# Patient Record
Sex: Male | Born: 1935 | Race: White | Hispanic: No | Marital: Married | Smoking: Former smoker
Health system: Southern US, Community
[De-identification: ages and names within clinical notes are randomized; demographics above are authoritative.]

## PROBLEM LIST (undated history)

## (undated) DIAGNOSIS — C189 Malignant neoplasm of colon, unspecified: Secondary | ICD-10-CM

## (undated) DIAGNOSIS — E785 Hyperlipidemia, unspecified: Secondary | ICD-10-CM

## (undated) DIAGNOSIS — N400 Enlarged prostate without lower urinary tract symptoms: Secondary | ICD-10-CM

## (undated) DIAGNOSIS — K219 Gastro-esophageal reflux disease without esophagitis: Secondary | ICD-10-CM

## (undated) DIAGNOSIS — G629 Polyneuropathy, unspecified: Secondary | ICD-10-CM

## (undated) DIAGNOSIS — E119 Type 2 diabetes mellitus without complications: Secondary | ICD-10-CM

## (undated) HISTORY — DX: Type 2 diabetes mellitus without complications: E11.9

## (undated) HISTORY — DX: Hyperlipidemia, unspecified: E78.5

## (undated) HISTORY — DX: Malignant neoplasm of colon, unspecified: C18.9

## (undated) HISTORY — DX: Polyneuropathy, unspecified: G62.9

## (undated) HISTORY — PX: FRACTURE SURGERY: SHX138

## (undated) HISTORY — DX: Benign prostatic hyperplasia without lower urinary tract symptoms: N40.0

## (undated) HISTORY — PX: CHOLECYSTECTOMY: SHX55

## (undated) HISTORY — DX: Gastro-esophageal reflux disease without esophagitis: K21.9

## (undated) HISTORY — PX: EYE SURGERY: SHX253

## (undated) HISTORY — PX: COLON SURGERY: SHX602

## (undated) HISTORY — PX: APPENDECTOMY: SHX54

---

## 1999-01-15 ENCOUNTER — Emergency Department (HOSPITAL_COMMUNITY): Admission: EM | Admit: 1999-01-15 | Discharge: 1999-01-15 | Payer: Self-pay | Admitting: Emergency Medicine

## 1999-01-15 ENCOUNTER — Encounter: Payer: Self-pay | Admitting: Specialist

## 1999-03-16 ENCOUNTER — Encounter: Payer: Self-pay | Admitting: Orthopedic Surgery

## 1999-03-16 ENCOUNTER — Inpatient Hospital Stay (HOSPITAL_COMMUNITY): Admission: RE | Admit: 1999-03-16 | Discharge: 1999-03-17 | Payer: Self-pay | Admitting: Orthopedic Surgery

## 2000-05-02 ENCOUNTER — Encounter: Admission: RE | Admit: 2000-05-02 | Discharge: 2000-07-31 | Payer: Self-pay | Admitting: Internal Medicine

## 2003-06-06 ENCOUNTER — Inpatient Hospital Stay (HOSPITAL_COMMUNITY): Admission: EM | Admit: 2003-06-06 | Discharge: 2003-06-14 | Payer: Self-pay | Admitting: Emergency Medicine

## 2004-11-03 ENCOUNTER — Ambulatory Visit (HOSPITAL_COMMUNITY): Admission: RE | Admit: 2004-11-03 | Discharge: 2004-11-03 | Payer: Self-pay | Admitting: Specialist

## 2004-11-16 ENCOUNTER — Ambulatory Visit (HOSPITAL_COMMUNITY): Admission: RE | Admit: 2004-11-16 | Discharge: 2004-11-16 | Payer: Self-pay | Admitting: Specialist

## 2005-12-10 ENCOUNTER — Encounter: Admission: RE | Admit: 2005-12-10 | Discharge: 2006-03-10 | Payer: Self-pay | Admitting: Internal Medicine

## 2006-05-01 ENCOUNTER — Ambulatory Visit (HOSPITAL_COMMUNITY): Admission: RE | Admit: 2006-05-01 | Discharge: 2006-05-01 | Payer: Self-pay | Admitting: Internal Medicine

## 2006-11-11 ENCOUNTER — Ambulatory Visit (HOSPITAL_COMMUNITY): Admission: RE | Admit: 2006-11-11 | Discharge: 2006-11-11 | Payer: Self-pay | Admitting: Neurology

## 2007-02-09 ENCOUNTER — Emergency Department (HOSPITAL_COMMUNITY): Admission: EM | Admit: 2007-02-09 | Discharge: 2007-02-09 | Payer: Self-pay | Admitting: Emergency Medicine

## 2007-04-18 ENCOUNTER — Ambulatory Visit (HOSPITAL_COMMUNITY): Admission: RE | Admit: 2007-04-18 | Discharge: 2007-04-18 | Payer: Self-pay | Admitting: Internal Medicine

## 2008-09-28 ENCOUNTER — Encounter: Payer: Self-pay | Admitting: Gastroenterology

## 2008-10-31 ENCOUNTER — Emergency Department (HOSPITAL_COMMUNITY): Admission: EM | Admit: 2008-10-31 | Discharge: 2008-10-31 | Payer: Self-pay | Admitting: Emergency Medicine

## 2008-10-31 ENCOUNTER — Encounter (INDEPENDENT_AMBULATORY_CARE_PROVIDER_SITE_OTHER): Payer: Self-pay | Admitting: *Deleted

## 2008-11-10 ENCOUNTER — Ambulatory Visit: Payer: Self-pay | Admitting: Gastroenterology

## 2008-11-10 DIAGNOSIS — R933 Abnormal findings on diagnostic imaging of other parts of digestive tract: Secondary | ICD-10-CM | POA: Insufficient documentation

## 2008-11-10 DIAGNOSIS — R109 Unspecified abdominal pain: Secondary | ICD-10-CM | POA: Insufficient documentation

## 2009-04-04 ENCOUNTER — Encounter: Admission: RE | Admit: 2009-04-04 | Discharge: 2009-04-04 | Payer: Self-pay | Admitting: Internal Medicine

## 2009-09-19 ENCOUNTER — Emergency Department (HOSPITAL_COMMUNITY): Admission: EM | Admit: 2009-09-19 | Discharge: 2009-09-19 | Payer: Self-pay | Admitting: Emergency Medicine

## 2009-09-28 ENCOUNTER — Ambulatory Visit: Payer: Self-pay | Admitting: Internal Medicine

## 2009-09-28 ENCOUNTER — Inpatient Hospital Stay (HOSPITAL_COMMUNITY): Admission: EM | Admit: 2009-09-28 | Discharge: 2009-10-13 | Payer: Self-pay | Admitting: Emergency Medicine

## 2009-09-30 ENCOUNTER — Encounter (INDEPENDENT_AMBULATORY_CARE_PROVIDER_SITE_OTHER): Payer: Self-pay | Admitting: Internal Medicine

## 2009-09-30 ENCOUNTER — Encounter: Payer: Self-pay | Admitting: Internal Medicine

## 2009-10-03 ENCOUNTER — Encounter: Payer: Self-pay | Admitting: Internal Medicine

## 2009-10-05 ENCOUNTER — Encounter (INDEPENDENT_AMBULATORY_CARE_PROVIDER_SITE_OTHER): Payer: Self-pay | Admitting: *Deleted

## 2009-10-05 ENCOUNTER — Encounter (INDEPENDENT_AMBULATORY_CARE_PROVIDER_SITE_OTHER): Payer: Self-pay | Admitting: Internal Medicine

## 2009-10-06 ENCOUNTER — Encounter (INDEPENDENT_AMBULATORY_CARE_PROVIDER_SITE_OTHER): Payer: Self-pay | Admitting: *Deleted

## 2009-10-11 ENCOUNTER — Ambulatory Visit: Payer: Self-pay | Admitting: Oncology

## 2009-10-11 ENCOUNTER — Ambulatory Visit: Payer: Self-pay | Admitting: Physical Medicine & Rehabilitation

## 2009-10-12 ENCOUNTER — Encounter (INDEPENDENT_AMBULATORY_CARE_PROVIDER_SITE_OTHER): Payer: Self-pay | Admitting: *Deleted

## 2009-10-25 ENCOUNTER — Ambulatory Visit: Payer: Self-pay | Admitting: Oncology

## 2009-12-09 ENCOUNTER — Ambulatory Visit: Payer: Self-pay | Admitting: Oncology

## 2009-12-13 LAB — COMPREHENSIVE METABOLIC PANEL
Albumin: 4 g/dL (ref 3.5–5.2)
CO2: 24 mEq/L (ref 19–32)
Chloride: 105 mEq/L (ref 96–112)
Glucose, Bld: 122 mg/dL — ABNORMAL HIGH (ref 70–99)
Potassium: 4.3 mEq/L (ref 3.5–5.3)
Sodium: 140 mEq/L (ref 135–145)
Total Protein: 6.6 g/dL (ref 6.0–8.3)

## 2009-12-13 LAB — CBC WITH DIFFERENTIAL/PLATELET
Basophils Absolute: 0 10*3/uL (ref 0.0–0.1)
Eosinophils Absolute: 0.1 10*3/uL (ref 0.0–0.5)
HGB: 12.3 g/dL — ABNORMAL LOW (ref 13.0–17.1)
MCV: 95 fL (ref 79.3–98.0)
NEUT#: 5.3 10*3/uL (ref 1.5–6.5)
RDW: 13.2 % (ref 11.0–14.6)
lymph#: 1.4 10*3/uL (ref 0.9–3.3)

## 2010-03-14 ENCOUNTER — Ambulatory Visit: Payer: Self-pay | Admitting: Oncology

## 2010-03-26 ENCOUNTER — Encounter: Payer: Self-pay | Admitting: Internal Medicine

## 2010-04-04 NOTE — Op Note (Signed)
Summary: Operative report      NAME:  Tony Moreno, Tony Moreno             ACCOUNT NO.:  [id number]      MEDICAL RECORD NO.:  [id number]          PATIENT TYPE:  INP      LOCATION:  1345                         FACILITY:  [facility]      PHYSICIAN:  Sharlet Salina T. Hoxworth, M.D.DATE OF BIRTH:  04-27-1920      DATE OF PROCEDURE:  10/05/2009   DATE OF DISCHARGE:                                  OPERATIVE REPORT      PREOPERATIVE DIAGNOSIS:  Obstructing carcinoma of the left colon.      POSTOPERATIVE DIAGNOSIS:  Obstructing carcinoma of the left colon.      SURGICAL PROCEDURE:  Left hemicolectomy with takedown of splenic   flexure.      SURGEON:  Lorne Skeens. Hoxworth, MD      ASSISTANT:  Abigail Miyamoto, MD      ANESTHESIA:  General.      BRIEF HISTORY:  Tony Moreno is an 77 year old male who presents with   progressive obstipation and abdominal distention.  CT scan has revealed   an obstructing apple-core lesion of the proximal left colon.  The   patient has had colonoscopic stenting of the area of the obstruction,   which has allowed a full mechanical bowel prep.  We have now recommended   proceeding with left colectomy and probable anastomosis.  The nature of   the procedure, its indications, risks with anesthetic complications,   bleeding, infection, anastomotic leak were discussed with the patient   and his family.  He was now brought to the operating room for this   procedure.      DESCRIPTION OF OPERATION:  The patient was brought to the operating   room, placed in supine position on the operating table and general   endotracheal anesthesia was induced.  PAS were in place.  He had   received broad-spectrum preoperative IV antibiotics.  The abdomen was   widely, sterilely prepped and draped.  Foley catheter was placed.   Correct patient and procedure were verified.  A midline incision   skirting the umbilicus was used and dissection carried down to the   midline fascia using  cautery.  The fascia was divided and the peritoneum   entered under direct vision.  The patient had a previous partial   gastrectomy many years ago.  There were fairly extensive adhesions in   the upper abdomen.  Omental adhesions to the anterior abdominal wall   were taken down extensively with the cautery until these were completely   freed up to the diaphragm.  There were no adhesions to the lower   abdomen.  The small bowel all appeared normal.  There was no   retroperitoneal adenopathy.  The liver was difficult to examine   completely, but we were able to feel, showed no evidence of tumor.   There was a circumferential area of firm induration and thickening in   the proximal sigmoid colon near the splenic flexure.  This, however, was   mobile and not stuck into the retroperitoneum or to any adjacent  organs.   The more distal left colon and sigmoid colon appeared normal.  We   elected to proceed with resection planning to resect from the   midtransverse colon down to a mobile portion of the sigmoid colon for   anastomosis.  The omentum was initially dissected up off the transverse   colon, mobilizing the midtransverse colon.  The omentum was fairly   densely adherent around the splenic flexure and somewhat to the area of   the tumor and this was left in place.  The sigmoid and left colon were   then extensively mobilized dividing lateral peritoneal attachments and   mobilized the mesentery up out of the retroperitoneum.  The area of the   tumor mobilized actually quite easily up out of the retroperitoneum.   The splenic colic ligament was then taken down with the LigaSure under   direct vision and the splenic flexure completely mobilized, relatively   easily.  The lesser omentum between the stomach and the distal   transverse colon was then divided with the LigaSure leaving the adherent   omentum onto the area of the splenic flexure and tumor.  We were then   able to enter the free  lesser sac and get behind the stomach and further   mobilized the distal transverse colon and completely divided the   gastrocolic omentum in this area and the omentum was divided at about   the midpoint of the transverse colon leaving a good portion of the   omentum.  The midtransverse colon near the middle colic vessels was   chosen for the area of anastomosis.  The bowel appeared well prepped.   It was somewhat chronically thickened likely due to chronic obstruction,   but otherwise appeared healthy and we felt it was suitable for   anastomosis.  The midsigmoid colon was quite mobile and would come up   easily into the upper abdomen and appeared well vascularized, was chosen   for the distal resection.  Initially, the sigmoid colon was divided at   its midpoint with single firing of the GIA 75-mm stapler.  The mesentery   of the sigmoid and distal left colon was then divided with the harmonic   scalpel.  Initially, we were away from the tumor distally and stayed   fairly near the bowel.  The mesentery of the left colon was then divided   working more proximally and taking more mesentery down around the   proximal left colon.  We then came around the splenic flexure.  The   transverse colon was then cleared of omentum and pericolic fat and the   planned area of anastomosis and divided with the GIA 75-mm stapler.   Finally, the mesentery of the distal transverse colon was taken with   harmonic scalpel as well.  Larger vessels throughout the mesentery as we   came across were additionally ligated or suture ligated with 2-0 silk.   The specimen was removed.  The abdomen was thoroughly irrigated and   inspected for hemostasis particularly around the left upper quadrant and   the spleen and there was no evidence of any bleeding.  The sigmoid colon   and the transverse colon were both quite mobile and came together very   easily in the midline without any tension and we felt we could do a nice     side-to-side stapled anastomosis.  This was done with a single firing of   the Endo GIA 75-mm stapler through  enterotomies in the sigmoid and   transverse colon.  The staple line was intact without bleeding.  The   common enterotomy was then closed in 2 layers with running 2-0 chromic   from either end of the enterotomy and with seromuscular 2-0 silk   sutures.  Again, there appeared to be a good blood supply, anastomosis   was under no tension.  The crotch of the anastomosis was reinforced with   a couple of 2-0 silk sutures.  There was a very large broad mesenteric   defect and I elected to leave this opened and leave the anastomosis on   top of the small bowel, which was free beneath it.  Following this the   abdomen was thoroughly irrigated and hemostasis was assured.  This   returned anatomic position.  The midline fascia was closed with running   looped #1 PDS begun at either end of the incision and tied centrally.   The subcutaneous tissue was irrigated and skin closed with staples.   Sponge and instruments count was correct.  The patient was taken to   Recovery in stable condition.               Lorne Skeens. Hoxworth, M.D.            Tory Emerald  D:  10/05/2009  T:  10/06/2009  Job:  [id number]      Electronically Signed by Glenna Fellows M.D. on 10/11/2009 02:17:11 PM

## 2010-04-04 NOTE — Miscellaneous (Signed)
Summary: Discharge summary      NAME:  Tony Moreno, Tony Moreno             ACCOUNT NO.:  [id number]      MEDICAL RECORD NO.:  [id number]          PATIENT TYPE:  INP      LOCATION:  1410                         FACILITY:  [facility]      PHYSICIAN:  Mark A. Perini, M.D.   DATE OF BIRTH:  1920-09-12      DATE OF ADMISSION:  09/28/2009   DATE OF DISCHARGE:                                  DISCHARGE SUMMARY      DISCHARGE DIAGNOSES:   1. Invasive of colon cancer 5 cm in size with 1/19 lymph nodes       positive for cancer status post hemicolectomy performed on October 06, 2009.  He had a left hemicolectomy with takedown of the splenic       flexure.   2. Type 2 diabetes, controlled.   3. Chronic gait instability.   4. Gastroesophageal reflux.   5. Osteoporosis.   6. Peripheral neuropathy.   7. B12 deficiency.   8. Hearing loss which is significant.   9. Benign prostatic hypertrophy.   10.Hyperlipidemia.   11.Vitamin D deficiency.   12.Depression.   13.Glaucoma.   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 Oncology consultation.   2. General surgery consultation.   3. Operation on October 06, 2009 as noted above.   4. Neurology consultation.   5. EEG on October 10, 2009 showing a mildly abnormal slow study due to       mild background slowing, but no evidence of epilepsy.   6. GI consultation with colonoscopy with stent placed across the colon       tumor prior to it being resected.   7. CT scan of the head on October 07, 2009 showing atrophy with minimal       small-vessel chronic ischemic changes of deep cerebral white       matter, but no acute intracranial abnormalities.   8. Barium enema performed on October 01, 2009 showing short segment       circumferential obstructing lesion in the mid descending colon.   9. CT scan of the abdomen and pelvis on September 28, 2009 which showed       partially obstructing mass within the mid descending colon, several   mildly enlarged adjacent pericolonic lymph nodes, multiple hepatic       cysts which appeared stable and small hiatal hernia with probable       gastroesophageal reflux.      DISCHARGE MEDICATIONS:   1. Lexapro 5 mg p.o. daily.   2. Famotidine 20 mg p.o. b.i.d.   3. Lorazepam 0.5 mg 1 pill under the tongue daily at bedtime as needed       for insomnia or agitation.   4. Aspirin 325 mg p.o. daily.   5. Calcium 600 mg daily with food.   6. Alendronate 70 mg once weekly as directed, but he should wait 3       weeks to resume this.   7. Metformin 500 mg  twice daily with food.   8. Metoclopramide 10 mg one p.o. q.i.d. p.r.n. reflux.   9. Multivitamin daily.   10.Timolol ophthalmic drops 0.5% one drop in the right eye daily.   11.Vitamin D2 50,000 units 1 capsule twice weekly on Sundays and       Thursdays.   12.B12 1000 mcg intramuscular once a month.   13.Xalatan 0.005% ophthalmic solution 1 drop in both eyes daily.   14.He is to stop cholestyramine and tramadol.      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  He had suffered a rib fracture, a chipped   tooth and some contusion to the right side of his face.  He was taking   Tylenol #3 and he became progressively more constipated.  He presented   to the emergency room for these symptoms and was found by CT scan to   have an obstructing colon mass.  He was admitted for further care.      HOSPITAL COURSE:  Tony Moreno was admitted to a regular bed.  He underwent   work up with Gastroenterology.  It was difficult to get a good colon   prep therefore a stent was placed across the colon mass.  He was then   able to get a colon prep and undergo a left hemicolectomy on October 06, 2009.  He did tolerate this well.  He received several days of TNA   intravenous nutrition.  This did cause some elevation in his liver  tests   and it was stopped several days after his colon surgery.  He remained   stable from a pulmonary standpoint during his stay.  He remained stable   from a cardiac standpoint during his stay.  He did have an episode of   difficulty speaking and change in mental status on October 07, 2009.  It   was unclear if he had a TIA or if he had just been overly sedated   somewhat on his pain medication.  Rapid response was called.  Neurology   evaluated the patient.  Cranial CT scan was unrevealing.  An EEG was   performed with no evidence of epilepsy.  He was placed on full dose   aspirin with no recurrence of these symptoms.  It may have simply been   some delirium or it could have been a TIA.  The next 2-3 days after this   he progressed well.  He was able to advance his diet.  He was moving his   bowels well and passing urine okay.  His TNA was stopped and his IV   fluids were discontinued.  He was evaluated by the rehab service, but   felt more appropriate for rehabilitation at a skilled nursing facility.   Therefore on October 13, 2009 he was deemed stable for discharge to [facility] skilled nursing facility.      DISCHARGE PHYSICAL EXAM:  VITAL SIGNS:  Temperature 97.9, afebrile,   pulse 76, respiratory rate 20, blood pressure 122/76.  Blood sugars   ranged from 117-199.   GENERAL APPEARANCE:  He was in no acute distress, alert and oriented x4,   sitting on the bedside commode.   LUNGS:  Clear to auscultation bilaterally.  No wheezes, rales or   rhonchi.   CARDIOVASCULAR:  Heart was regular  rate and rhythm with no murmur, rub   or gallop.   EXTREMITIES:  There was no peripheral edema.      DISCHARGE LABORATORY DATA:  On October 12, 2009 sodium 137, potassium   4.0, chloride 103, CO2 27, BUN 14, creatinine 0.72, glucose 100, GFR   greater than 60, total bili 0.8, alk phos 199, AST 35, ALT 100, total   protein 5.6, albumin 2.3, calcium 7.9.  White count 8.0, hemoglobin 9.4,   platelet  count 153,000.  There are 73% segs, 13% lymphocytes, 9%   monocytes.  Prealbumin on October 10, 2009 was 15.1 mg/dL.  Triglycerides   were 62 on October 10, 2009.  Total cholesterol was 148 on October 10, 2009.   Magnesium was 1.9 on October 10, 2009.  Ferritin was 864 on October 08, 2009.  Iron level was 58 mcg/dL which is normal.  TIBC was slightly low   at 154 mcg/dL.  Percent saturation of iron was normal at 38%.  MRSA PCR   screening was negative for MRSA on October 07, 2009.  Pathology report   from his colectomy is read as invasive moderately differentiated   adenocarcinoma 5 cm PT3 N1 MX with 1/19 lymph nodes positive for tumor.   Oncology discussed the situation with the patient and it was felt that   he would not undergo chemotherapy and he would just have expectant   watchful waiting for his further treatment for this for now.      DISCHARGE INSTRUCTIONS:  Tony Moreno is to work with physical therapy and   occupational therapy.  He is to use a walker.  He is to keep hydrated.   He is to follow-up in 1 week after release from [facility] with Dr.   Waynard Edwards.  He is to follow-up per Dr. Jamse Mead surgeon's instructions.   Wound care per the surgery team.  Dr. Donnie Coffin is the oncologist and he   should most likely follow up with him in 2-3 months simply to check in   and give some advice for further follow-up.  He is to call if he has any   recurrent problems.  He was initially a do not resuscitate code status.   This was revoked during this hospital stay.  However, on the day prior   to discharge he wanted to put the do not resuscitate order back on his   chart and he would like to have this as an outpatient as well.  This   will be honored.  70 minutes were spent on the unit with this discharge   process.               Mark A. Waynard Edwards, M.D.            MAP/MEDQ  D:  10/12/2009  T:  10/12/2009  Job:  [id number]      Electronically Signed by Rodrigo Ran M.D. on 10/16/2009 03:44:30 PM

## 2010-04-04 NOTE — Procedures (Signed)
Summary: Colonoscopy  Patient: [name] Note: All result statuses are Final unless otherwise noted.  Tests: (1) Colonoscopy (COL)   COL Colonoscopy           DONE     [facility]     [address] [place], [place]  [address]           COLONOSCOPY PROCEDURE REPORT           PATIENT:  Tony, Moreno  MR#:  [id number]     BIRTHDATE:  01/08/1921, 88 yrs. old  GENDER:  male     ENDOSCOPIST:  Wilhemina Bonito. Eda Keys, MD     REF. BY:  Rodrigo Ran, M.D.     PROCEDURE DATE:  10/03/2009     PROCEDURE:  Colonoscopy with Self expanding Metal Stent placement     (WallFlex 90mm x 22mm)     ASA CLASS:  Class II     INDICATIONS:  therapy -stent placement for obstructing tumor     MEDICATIONS:   Fentanyl 100 mcg IV, Versed 4 mg           DESCRIPTION OF PROCEDURE:   After the risks benefits and     alternatives of the procedure were thoroughly explained, informed     consent was obtained.  Digital rectal exam was performed and     revealed no abnormalities.   The Pentax Colonoscope [id number]     endoscope was introduced through the anus and advanced to the     descending colon, limited by an obstruction.    The quality of the     prep was excellent, using Fleets Enemas.  The instrument was then     slowly withdrawn as the colon was fully examined.     <<PROCEDUREIMAGES>>           FINDINGS:  Obstructing cancer in the descending colon with high     grade stricture measuring 4-5cm in lenth. THERAPY: WITH THE USE OF     FLUOROSCOPY AND CONTRAST MEDIA THE STRICTURE WAS DEFINED. A     GUIDEWIRE PLACED PROXIMAL AND A LONG X DIAMETER WALLFLEX     STENT WAS PLACED IN GOOD POSTION ACROSS THE STRICTURE. SEE X-RAYS.     Retroflexed views in the rectum revealed not done.    The scope     was then withdrawn from the patient and the procedure completed.           COMPLICATIONS:  None     ENDOSCOPIC IMPRESSION:     1) Obstructing cancer in the descending colon - S/P WALLFLEX  STENT PLACEMENT           RECOMMENDATIONS:     1) PREP FOR SURGICAL RESECTION PER GENERAL SURGERY           ______________________________     Wilhemina Bonito. Eda Keys, MD           CC:  Rodrigo Ran, MD;Malcolm Russella Dar, MD;Steven Gross, MD;The     Patient           n.     Rosalie DoctorWilhemina Bonito. Eda Keys at 10/03/2009 01:38 PM           Mackey Birchwood, [id number]  Note: An exclamation mark (!) indicates a result that was not dispersed into the flowsheet. Document Creation Date: 10/03/2009 1:38 PM _______________________________________________________________________  (1) Order result status: Final Collection or observation date-time: 10/03/2009 13:25 Requested date-time:  Receipt  date-time:  Reported date-time:  Referring Physician:   Ordering Physician: Fransico Setters [phone]) Specimen Source:  Source: Launa Grill Order Number: [id number] Lab site:

## 2010-04-04 NOTE — Procedures (Signed)
Summary: Flexible Sigmoidoscopy  Patient: Jamael Hoffmann Note: All result statuses are Final unless otherwise noted.  Tests: (1) Flexible Sigmoidoscopy (FLX)  FLX Flexible Sigmoidoscopy                             DONE     [facility]     [address] [place], [place]  [address]           FLEXIBLE SIGMOIDOSCOPY PROCEDURE REPORT           PATIENT:  Mariusz, Jubb  MR#:  [id number]     BIRTHDATE:  March 28, 1920, 88 yrs. old  GENDER:  male           ENDOSCOPIST:  Wilhemina Bonito. Eda Keys, MD     Referred by:  Rodrigo Ran, M.D.           PROCEDURE DATE:  09/30/2009     PROCEDURE:  Flexible Sigmoidoscopy with biopsy     ASA CLASS:  Class II     INDICATIONS:  abnormal imaging           MEDICATIONS:   Fentanyl 25 mcg IV, Versed 2 mg IV           DESCRIPTION OF PROCEDURE:   After the risks benefits and     alternatives of the procedure were thoroughly explained, informed     consent was obtained.  Digital rectal exam was performed and     revealed no abnormalities.   The [id number] endoscope     was introduced through the anus and advanced to the descending     colon, limited by an obstruction. Minimal to no air insufflation     was used.  The quality of the prep was good.  The instrument was     then slowly withdrawn as the mucosa was fully examined.     <<PROCEDUREIMAGES>>           An obstructing mass c/w colon cancer was found in the descending     colon. Bx taken . Otherwise normal mucosa distal to the lesion.     Retroflexed views in the rectum revealed not performed.    The     scope was then withdrawn from the patient and the procedure     terminated.           COMPLICATIONS:  None           ENDOSCOPIC IMPRESSION:     1) Obstructing Mass in the descending colon           RECOMMENDATIONS:     1)  Await biopsy results     2)  NG tube placement     3) Gastrograffin enema in am to help define stricture and     feasability of endoscopic stenting as a  bridge to surgery           ____________________________     Wilhemina Bonito. Eda Keys, MD           CC:  Rodrigo Ran, MD, Claudette Head, MD, Karie Soda, MD, The     Patient           n.     eSIGNED:   Wilhemina Bonito. Eda Keys at 09/30/2009 11:16 AM           Mackey Birchwood, [id number]  Note: An exclamation mark (!) indicates a result that was not dispersed into  the flowsheet. Document Creation Date: 09/30/2009 11:16 AM _______________________________________________________________________  (1) Order result status: Final Collection or observation date-time: 09/30/2009 11:01 Requested date-time:  Receipt date-time:  Reported date-time:  Referring Physician:   Ordering Physician: Fransico Setters [phone]) Specimen Source:  Source: Launa Grill Order Number: [id number] Lab site:

## 2010-04-13 ENCOUNTER — Other Ambulatory Visit: Payer: Self-pay | Admitting: Oncology

## 2010-04-13 ENCOUNTER — Encounter (HOSPITAL_BASED_OUTPATIENT_CLINIC_OR_DEPARTMENT_OTHER): Payer: Medicare Other | Admitting: Oncology

## 2010-04-13 DIAGNOSIS — C189 Malignant neoplasm of colon, unspecified: Secondary | ICD-10-CM

## 2010-04-13 LAB — COMPREHENSIVE METABOLIC PANEL
ALT: 28 U/L (ref 0–53)
Albumin: 3.8 g/dL (ref 3.5–5.2)
Alkaline Phosphatase: 62 U/L (ref 39–117)
Glucose, Bld: 153 mg/dL — ABNORMAL HIGH (ref 70–99)
Potassium: 4.5 mEq/L (ref 3.5–5.3)
Sodium: 141 mEq/L (ref 135–145)
Total Protein: 6.6 g/dL (ref 6.0–8.3)

## 2010-04-13 LAB — CBC WITH DIFFERENTIAL/PLATELET
Eosinophils Absolute: 0.1 10*3/uL (ref 0.0–0.5)
MCV: 97.1 fL (ref 79.3–98.0)
MONO#: 0.7 10*3/uL (ref 0.1–0.9)
MONO%: 11.1 % (ref 0.0–14.0)
NEUT#: 3.9 10*3/uL (ref 1.5–6.5)
RBC: 3.56 10*6/uL — ABNORMAL LOW (ref 4.20–5.82)
RDW: 13.5 % (ref 11.0–14.6)
WBC: 6.2 10*3/uL (ref 4.0–10.3)

## 2010-05-19 LAB — CBC
HCT: 25 % — ABNORMAL LOW (ref 39.0–52.0)
HCT: 27.3 % — ABNORMAL LOW (ref 39.0–52.0)
HCT: 27.5 % — ABNORMAL LOW (ref 39.0–52.0)
HCT: 27.7 % — ABNORMAL LOW (ref 39.0–52.0)
HCT: 28 % — ABNORMAL LOW (ref 39.0–52.0)
HCT: 28.7 % — ABNORMAL LOW (ref 39.0–52.0)
HCT: 32.2 % — ABNORMAL LOW (ref 39.0–52.0)
Hemoglobin: 11 g/dL — ABNORMAL LOW (ref 13.0–17.0)
Hemoglobin: 11.2 g/dL — ABNORMAL LOW (ref 13.0–17.0)
Hemoglobin: 9.4 g/dL — ABNORMAL LOW (ref 13.0–17.0)
Hemoglobin: 9.5 g/dL — ABNORMAL LOW (ref 13.0–17.0)
MCH: 33.3 pg (ref 26.0–34.0)
MCH: 33.3 pg (ref 26.0–34.0)
MCH: 33.7 pg (ref 26.0–34.0)
MCH: 33.7 pg (ref 26.0–34.0)
MCH: 34.1 pg — ABNORMAL HIGH (ref 26.0–34.0)
MCH: 34.2 pg — ABNORMAL HIGH (ref 26.0–34.0)
MCHC: 33.9 g/dL (ref 30.0–36.0)
MCHC: 34 g/dL (ref 30.0–36.0)
MCHC: 34.1 g/dL (ref 30.0–36.0)
MCHC: 34.3 g/dL (ref 30.0–36.0)
MCHC: 34.5 g/dL (ref 30.0–36.0)
MCHC: 34.8 g/dL (ref 30.0–36.0)
MCV: 95.6 fL (ref 78.0–100.0)
MCV: 97 fL (ref 78.0–100.0)
MCV: 97.6 fL (ref 78.0–100.0)
MCV: 98 fL (ref 78.0–100.0)
MCV: 98 fL (ref 78.0–100.0)
MCV: 98.1 fL (ref 78.0–100.0)
MCV: 98.1 fL (ref 78.0–100.0)
MCV: 98.2 fL (ref 78.0–100.0)
Platelets: 168 10*3/uL (ref 150–400)
Platelets: 182 10*3/uL (ref 150–400)
Platelets: 182 10*3/uL (ref 150–400)
Platelets: 230 10*3/uL (ref 150–400)
Platelets: 353 10*3/uL (ref 150–400)
Platelets: 366 10*3/uL (ref 150–400)
RBC: 2.58 MIL/uL — ABNORMAL LOW (ref 4.22–5.81)
RBC: 2.82 MIL/uL — ABNORMAL LOW (ref 4.22–5.81)
RBC: 2.85 MIL/uL — ABNORMAL LOW (ref 4.22–5.81)
RDW: 12.3 % (ref 11.5–15.5)
RDW: 12.4 % (ref 11.5–15.5)
RDW: 12.4 % (ref 11.5–15.5)
RDW: 12.7 % (ref 11.5–15.5)
RDW: 12.9 % (ref 11.5–15.5)
RDW: 13.1 % (ref 11.5–15.5)
WBC: 10.9 10*3/uL — ABNORMAL HIGH (ref 4.0–10.5)
WBC: 6.2 10*3/uL (ref 4.0–10.5)
WBC: 6.3 10*3/uL (ref 4.0–10.5)
WBC: 8 10*3/uL (ref 4.0–10.5)
WBC: 9 10*3/uL (ref 4.0–10.5)

## 2010-05-19 LAB — DIFFERENTIAL
Basophils Absolute: 0 10*3/uL (ref 0.0–0.1)
Basophils Absolute: 0 10*3/uL (ref 0.0–0.1)
Basophils Absolute: 0 10*3/uL (ref 0.0–0.1)
Basophils Absolute: 0 10*3/uL (ref 0.0–0.1)
Basophils Absolute: 0 10*3/uL (ref 0.0–0.1)
Basophils Absolute: 0.1 10*3/uL (ref 0.0–0.1)
Basophils Absolute: 0.1 10*3/uL (ref 0.0–0.1)
Basophils Relative: 0 % (ref 0–1)
Basophils Relative: 0 % (ref 0–1)
Basophils Relative: 0 % (ref 0–1)
Basophils Relative: 0 % (ref 0–1)
Basophils Relative: 1 % (ref 0–1)
Basophils Relative: 1 % (ref 0–1)
Eosinophils Absolute: 0 10*3/uL (ref 0.0–0.7)
Eosinophils Absolute: 0.1 10*3/uL (ref 0.0–0.7)
Eosinophils Absolute: 0.2 10*3/uL (ref 0.0–0.7)
Eosinophils Absolute: 0.2 10*3/uL (ref 0.0–0.7)
Eosinophils Absolute: 0.3 10*3/uL (ref 0.0–0.7)
Eosinophils Absolute: 0.4 10*3/uL (ref 0.0–0.7)
Eosinophils Absolute: 0.4 10*3/uL (ref 0.0–0.7)
Eosinophils Relative: 0 % (ref 0–5)
Eosinophils Relative: 1 % (ref 0–5)
Eosinophils Relative: 2 % (ref 0–5)
Eosinophils Relative: 3 % (ref 0–5)
Eosinophils Relative: 4 % (ref 0–5)
Eosinophils Relative: 5 % (ref 0–5)
Eosinophils Relative: 5 % (ref 0–5)
Eosinophils Relative: 5 % (ref 0–5)
Lymphocytes Relative: 11 % — ABNORMAL LOW (ref 12–46)
Lymphocytes Relative: 12 % (ref 12–46)
Lymphocytes Relative: 12 % (ref 12–46)
Lymphocytes Relative: 3 % — ABNORMAL LOW (ref 12–46)
Lymphocytes Relative: 4 % — ABNORMAL LOW (ref 12–46)
Lymphocytes Relative: 7 % — ABNORMAL LOW (ref 12–46)
Lymphs Abs: 0.7 10*3/uL (ref 0.7–4.0)
Lymphs Abs: 0.8 10*3/uL (ref 0.7–4.0)
Lymphs Abs: 0.8 10*3/uL (ref 0.7–4.0)
Lymphs Abs: 1 10*3/uL (ref 0.7–4.0)
Lymphs Abs: 1.1 10*3/uL (ref 0.7–4.0)
Monocytes Absolute: 0.6 10*3/uL (ref 0.1–1.0)
Monocytes Absolute: 0.6 10*3/uL (ref 0.1–1.0)
Monocytes Absolute: 0.6 10*3/uL (ref 0.1–1.0)
Monocytes Absolute: 0.7 10*3/uL (ref 0.1–1.0)
Monocytes Absolute: 0.8 10*3/uL (ref 0.1–1.0)
Monocytes Absolute: 1.5 10*3/uL — ABNORMAL HIGH (ref 0.1–1.0)
Monocytes Relative: 11 % (ref 3–12)
Monocytes Relative: 11 % (ref 3–12)
Monocytes Relative: 6 % (ref 3–12)
Monocytes Relative: 9 % (ref 3–12)
Monocytes Relative: 9 % (ref 3–12)
Neutro Abs: 12.3 10*3/uL — ABNORMAL HIGH (ref 1.7–7.7)
Neutro Abs: 4.3 10*3/uL (ref 1.7–7.7)
Neutro Abs: 4.8 10*3/uL (ref 1.7–7.7)
Neutro Abs: 6.2 10*3/uL (ref 1.7–7.7)
Neutro Abs: 7.3 10*3/uL (ref 1.7–7.7)
Neutrophils Relative %: 72 % (ref 43–77)
Neutrophils Relative %: 74 % (ref 43–77)
Neutrophils Relative %: 75 % (ref 43–77)
Neutrophils Relative %: 76 % (ref 43–77)

## 2010-05-19 LAB — COMPREHENSIVE METABOLIC PANEL
ALT: 100 U/L — ABNORMAL HIGH (ref 0–53)
ALT: 169 U/L — ABNORMAL HIGH (ref 0–53)
ALT: 186 U/L — ABNORMAL HIGH (ref 0–53)
ALT: 236 U/L — ABNORMAL HIGH (ref 0–53)
AST: 12 U/L (ref 0–37)
AST: 167 U/L — ABNORMAL HIGH (ref 0–37)
AST: 18 U/L (ref 0–37)
AST: 216 U/L — ABNORMAL HIGH (ref 0–37)
AST: 37 U/L (ref 0–37)
Albumin: 1.9 g/dL — ABNORMAL LOW (ref 3.5–5.2)
Albumin: 1.9 g/dL — ABNORMAL LOW (ref 3.5–5.2)
Albumin: 2 g/dL — ABNORMAL LOW (ref 3.5–5.2)
Albumin: 2.1 g/dL — ABNORMAL LOW (ref 3.5–5.2)
Albumin: 2.3 g/dL — ABNORMAL LOW (ref 3.5–5.2)
Albumin: 2.4 g/dL — ABNORMAL LOW (ref 3.5–5.2)
Alkaline Phosphatase: 137 U/L — ABNORMAL HIGH (ref 39–117)
Alkaline Phosphatase: 187 U/L — ABNORMAL HIGH (ref 39–117)
Alkaline Phosphatase: 190 U/L — ABNORMAL HIGH (ref 39–117)
Alkaline Phosphatase: 199 U/L — ABNORMAL HIGH (ref 39–117)
Alkaline Phosphatase: 215 U/L — ABNORMAL HIGH (ref 39–117)
BUN: 11 mg/dL (ref 6–23)
BUN: 13 mg/dL (ref 6–23)
BUN: 15 mg/dL (ref 6–23)
BUN: 18 mg/dL (ref 6–23)
BUN: 18 mg/dL (ref 6–23)
BUN: 18 mg/dL (ref 6–23)
BUN: 18 mg/dL (ref 6–23)
CO2: 26 mEq/L (ref 19–32)
CO2: 26 mEq/L (ref 19–32)
CO2: 26 mEq/L (ref 19–32)
CO2: 28 mEq/L (ref 19–32)
CO2: 30 mEq/L (ref 19–32)
Calcium: 7.5 mg/dL — ABNORMAL LOW (ref 8.4–10.5)
Calcium: 7.7 mg/dL — ABNORMAL LOW (ref 8.4–10.5)
Calcium: 7.8 mg/dL — ABNORMAL LOW (ref 8.4–10.5)
Calcium: 8 mg/dL — ABNORMAL LOW (ref 8.4–10.5)
Calcium: 8 mg/dL — ABNORMAL LOW (ref 8.4–10.5)
Calcium: 8 mg/dL — ABNORMAL LOW (ref 8.4–10.5)
Chloride: 102 mEq/L (ref 96–112)
Chloride: 102 mEq/L (ref 96–112)
Chloride: 102 mEq/L (ref 96–112)
Chloride: 104 mEq/L (ref 96–112)
Chloride: 104 mEq/L (ref 96–112)
Creatinine, Ser: 0.56 mg/dL (ref 0.4–1.5)
Creatinine, Ser: 0.57 mg/dL (ref 0.4–1.5)
Creatinine, Ser: 0.6 mg/dL (ref 0.4–1.5)
Creatinine, Ser: 0.62 mg/dL (ref 0.4–1.5)
Creatinine, Ser: 0.67 mg/dL (ref 0.4–1.5)
Creatinine, Ser: 0.77 mg/dL (ref 0.4–1.5)
GFR calc Af Amer: >60 mL/min (ref >60)
GFR calc Af Amer: >60 mL/min (ref >60)
GFR calc Af Amer: >60 mL/min (ref >60)
GFR calc Af Amer: >60 mL/min (ref >60)
GFR calc Af Amer: >60 mL/min (ref >60)
GFR calc non Af Amer: >60 mL/min (ref >60)
GFR calc non Af Amer: >60 mL/min (ref >60)
GFR calc non Af Amer: >60 mL/min (ref >60)
GFR calc non Af Amer: >60 mL/min (ref >60)
GFR calc non Af Amer: >60 mL/min (ref >60)
GFR calc non Af Amer: >60 mL/min (ref >60)
Glucose, Bld: 107 mg/dL — ABNORMAL HIGH (ref 70–99)
Glucose, Bld: 112 mg/dL — ABNORMAL HIGH (ref 70–99)
Glucose, Bld: 151 mg/dL — ABNORMAL HIGH (ref 70–99)
Glucose, Bld: 159 mg/dL — ABNORMAL HIGH (ref 70–99)
Glucose, Bld: 68 mg/dL — ABNORMAL LOW (ref 70–99)
Potassium: 3.8 mEq/L (ref 3.5–5.1)
Potassium: 3.8 mEq/L (ref 3.5–5.1)
Potassium: 4 mEq/L (ref 3.5–5.1)
Potassium: 4 mEq/L (ref 3.5–5.1)
Sodium: 136 mEq/L (ref 135–145)
Sodium: 137 mEq/L (ref 135–145)
Sodium: 137 mEq/L (ref 135–145)
Sodium: 138 mEq/L (ref 135–145)
Sodium: 138 mEq/L (ref 135–145)
Total Bilirubin: 0.6 mg/dL (ref 0.3–1.2)
Total Bilirubin: 0.7 mg/dL (ref 0.3–1.2)
Total Bilirubin: 0.7 mg/dL (ref 0.3–1.2)
Total Bilirubin: 0.7 mg/dL (ref 0.3–1.2)
Total Bilirubin: 0.7 mg/dL (ref 0.3–1.2)
Total Bilirubin: 0.8 mg/dL (ref 0.3–1.2)
Total Protein: 4.9 g/dL — ABNORMAL LOW (ref 6.0–8.3)
Total Protein: 5 g/dL — ABNORMAL LOW (ref 6.0–8.3)
Total Protein: 5.1 g/dL — ABNORMAL LOW (ref 6.0–8.3)
Total Protein: 5.3 g/dL — ABNORMAL LOW (ref 6.0–8.3)
Total Protein: 5.6 g/dL — ABNORMAL LOW (ref 6.0–8.3)

## 2010-05-19 LAB — GLUCOSE, CAPILLARY
Glucose-Capillary: 105 mg/dL — ABNORMAL HIGH (ref 70–99)
Glucose-Capillary: 111 mg/dL — ABNORMAL HIGH (ref 70–99)
Glucose-Capillary: 113 mg/dL — ABNORMAL HIGH (ref 70–99)
Glucose-Capillary: 116 mg/dL — ABNORMAL HIGH (ref 70–99)
Glucose-Capillary: 121 mg/dL — ABNORMAL HIGH (ref 70–99)
Glucose-Capillary: 121 mg/dL — ABNORMAL HIGH (ref 70–99)
Glucose-Capillary: 127 mg/dL — ABNORMAL HIGH (ref 70–99)
Glucose-Capillary: 131 mg/dL — ABNORMAL HIGH (ref 70–99)
Glucose-Capillary: 135 mg/dL — ABNORMAL HIGH (ref 70–99)
Glucose-Capillary: 140 mg/dL — ABNORMAL HIGH (ref 70–99)
Glucose-Capillary: 145 mg/dL — ABNORMAL HIGH (ref 70–99)
Glucose-Capillary: 152 mg/dL — ABNORMAL HIGH (ref 70–99)
Glucose-Capillary: 158 mg/dL — ABNORMAL HIGH (ref 70–99)
Glucose-Capillary: 160 mg/dL — ABNORMAL HIGH (ref 70–99)
Glucose-Capillary: 199 mg/dL — ABNORMAL HIGH (ref 70–99)
Glucose-Capillary: 232 mg/dL — ABNORMAL HIGH (ref 70–99)
Glucose-Capillary: 238 mg/dL — ABNORMAL HIGH (ref 70–99)
Glucose-Capillary: 244 mg/dL — ABNORMAL HIGH (ref 70–99)
Glucose-Capillary: 254 mg/dL — ABNORMAL HIGH (ref 70–99)
Glucose-Capillary: 271 mg/dL — ABNORMAL HIGH (ref 70–99)
Glucose-Capillary: 273 mg/dL — ABNORMAL HIGH (ref 70–99)
Glucose-Capillary: 274 mg/dL — ABNORMAL HIGH (ref 70–99)
Glucose-Capillary: 288 mg/dL — ABNORMAL HIGH (ref 70–99)
Glucose-Capillary: 291 mg/dL — ABNORMAL HIGH (ref 70–99)
Glucose-Capillary: 77 mg/dL (ref 70–99)
Glucose-Capillary: 86 mg/dL (ref 70–99)

## 2010-05-19 LAB — IRON AND TIBC: UIBC: 96 ug/dL

## 2010-05-19 LAB — PHOSPHORUS
Phosphorus: 2.8 mg/dL (ref 2.3–4.6)
Phosphorus: 5.2 mg/dL — ABNORMAL HIGH (ref 2.3–4.6)

## 2010-05-19 LAB — FERRITIN: Ferritin: 864 ng/mL — ABNORMAL HIGH (ref 22–322)

## 2010-05-19 LAB — MAGNESIUM: Magnesium: 1.9 mg/dL (ref 1.5–2.5)

## 2010-05-19 LAB — CHOLESTEROL, TOTAL: Cholesterol: 150 mg/dL (ref 0–200)

## 2010-05-19 LAB — MRSA PCR SCREENING: MRSA by PCR: NEGATIVE

## 2010-05-19 LAB — PREALBUMIN: Prealbumin: 15.1 mg/dL — ABNORMAL LOW (ref 18.0–45.0)

## 2010-05-20 LAB — COMPREHENSIVE METABOLIC PANEL
ALT: 20 U/L (ref 0–53)
ALT: 21 U/L (ref 0–53)
ALT: 22 U/L (ref 0–53)
ALT: 27 U/L (ref 0–53)
AST: 14 U/L (ref 0–37)
AST: 17 U/L (ref 0–37)
Albumin: 2.7 g/dL — ABNORMAL LOW (ref 3.5–5.2)
Albumin: 2.7 g/dL — ABNORMAL LOW (ref 3.5–5.2)
Albumin: 2.8 g/dL — ABNORMAL LOW (ref 3.5–5.2)
Albumin: 2.8 g/dL — ABNORMAL LOW (ref 3.5–5.2)
Alkaline Phosphatase: 55 U/L (ref 39–117)
Alkaline Phosphatase: 56 U/L (ref 39–117)
Alkaline Phosphatase: 75 U/L (ref 39–117)
BUN: 13 mg/dL (ref 6–23)
BUN: 3 mg/dL — ABNORMAL LOW (ref 6–23)
Calcium: 6.9 mg/dL — ABNORMAL LOW (ref 8.4–10.5)
Calcium: 7.7 mg/dL — ABNORMAL LOW (ref 8.4–10.5)
Chloride: 104 mEq/L (ref 96–112)
Chloride: 105 mEq/L (ref 96–112)
Creatinine, Ser: 0.58 mg/dL (ref 0.4–1.5)
Creatinine, Ser: 0.67 mg/dL (ref 0.4–1.5)
GFR calc Af Amer: >60 mL/min (ref >60)
GFR calc non Af Amer: >60 mL/min (ref >60)
Glucose, Bld: 157 mg/dL — ABNORMAL HIGH (ref 70–99)
Glucose, Bld: 174 mg/dL — ABNORMAL HIGH (ref 70–99)
Glucose, Bld: 181 mg/dL — ABNORMAL HIGH (ref 70–99)
Potassium: 3.5 mEq/L (ref 3.5–5.1)
Potassium: 3.8 mEq/L (ref 3.5–5.1)
Potassium: 3.9 mEq/L (ref 3.5–5.1)
Sodium: 133 mEq/L — ABNORMAL LOW (ref 135–145)
Sodium: 137 mEq/L (ref 135–145)
Sodium: 137 mEq/L (ref 135–145)
Sodium: 140 mEq/L (ref 135–145)
Total Bilirubin: 0.8 mg/dL (ref 0.3–1.2)
Total Bilirubin: 1 mg/dL (ref 0.3–1.2)
Total Protein: 5 g/dL — ABNORMAL LOW (ref 6.0–8.3)
Total Protein: 5.1 g/dL — ABNORMAL LOW (ref 6.0–8.3)
Total Protein: 5.3 g/dL — ABNORMAL LOW (ref 6.0–8.3)
Total Protein: 5.8 g/dL — ABNORMAL LOW (ref 6.0–8.3)

## 2010-05-20 LAB — URINALYSIS, ROUTINE W REFLEX MICROSCOPIC
Nitrite: NEGATIVE
Protein, ur: NEGATIVE mg/dL
Specific Gravity, Urine: 1.02 (ref 1.005–1.030)
Urobilinogen, UA: 0.2 mg/dL (ref 0.0–1.0)

## 2010-05-20 LAB — GLUCOSE, CAPILLARY
Glucose-Capillary: 127 mg/dL — ABNORMAL HIGH (ref 70–99)
Glucose-Capillary: 131 mg/dL — ABNORMAL HIGH (ref 70–99)
Glucose-Capillary: 140 mg/dL — ABNORMAL HIGH (ref 70–99)
Glucose-Capillary: 150 mg/dL — ABNORMAL HIGH (ref 70–99)
Glucose-Capillary: 150 mg/dL — ABNORMAL HIGH (ref 70–99)
Glucose-Capillary: 162 mg/dL — ABNORMAL HIGH (ref 70–99)
Glucose-Capillary: 168 mg/dL — ABNORMAL HIGH (ref 70–99)
Glucose-Capillary: 175 mg/dL — ABNORMAL HIGH (ref 70–99)
Glucose-Capillary: 185 mg/dL — ABNORMAL HIGH (ref 70–99)
Glucose-Capillary: 195 mg/dL — ABNORMAL HIGH (ref 70–99)
Glucose-Capillary: 213 mg/dL — ABNORMAL HIGH (ref 70–99)
Glucose-Capillary: 251 mg/dL — ABNORMAL HIGH (ref 70–99)
Glucose-Capillary: 260 mg/dL — ABNORMAL HIGH (ref 70–99)
Glucose-Capillary: 273 mg/dL — ABNORMAL HIGH (ref 70–99)

## 2010-05-20 LAB — DIFFERENTIAL
Basophils Absolute: 0 10*3/uL (ref 0.0–0.1)
Basophils Absolute: 0 10*3/uL (ref 0.0–0.1)
Basophils Relative: 0 % (ref 0–1)
Basophils Relative: 0 % (ref 0–1)
Basophils Relative: 0 % (ref 0–1)
Basophils Relative: 0 % (ref 0–1)
Eosinophils Absolute: 0 10*3/uL (ref 0.0–0.7)
Eosinophils Absolute: 0.1 10*3/uL (ref 0.0–0.7)
Eosinophils Absolute: 0.1 10*3/uL (ref 0.0–0.7)
Eosinophils Absolute: 0.1 10*3/uL (ref 0.0–0.7)
Eosinophils Relative: 1 % (ref 0–5)
Eosinophils Relative: 1 % (ref 0–5)
Eosinophils Relative: 1 % (ref 0–5)
Lymphocytes Relative: 14 % (ref 12–46)
Lymphs Abs: 0.7 10*3/uL (ref 0.7–4.0)
Lymphs Abs: 0.8 10*3/uL (ref 0.7–4.0)
Monocytes Absolute: 0.8 10*3/uL (ref 0.1–1.0)
Monocytes Absolute: 0.8 10*3/uL (ref 0.1–1.0)
Monocytes Absolute: 1.1 10*3/uL — ABNORMAL HIGH (ref 0.1–1.0)
Monocytes Relative: 11 % (ref 3–12)
Monocytes Relative: 11 % (ref 3–12)
Monocytes Relative: 14 % — ABNORMAL HIGH (ref 3–12)
Monocytes Relative: 15 % — ABNORMAL HIGH (ref 3–12)
Neutro Abs: 4 10*3/uL (ref 1.7–7.7)
Neutro Abs: 7.3 10*3/uL (ref 1.7–7.7)
Neutrophils Relative %: 72 % (ref 43–77)
Neutrophils Relative %: 80 % — ABNORMAL HIGH (ref 43–77)

## 2010-05-20 LAB — CBC
HCT: 31.4 % — ABNORMAL LOW (ref 39.0–52.0)
HCT: 33.5 % — ABNORMAL LOW (ref 39.0–52.0)
Hemoglobin: 10.8 g/dL — ABNORMAL LOW (ref 13.0–17.0)
MCH: 33.8 pg (ref 26.0–34.0)
MCH: 34.1 pg — ABNORMAL HIGH (ref 26.0–34.0)
MCHC: 34.3 g/dL (ref 30.0–36.0)
MCHC: 34.8 g/dL (ref 30.0–36.0)
MCV: 97.9 fL (ref 78.0–100.0)
MCV: 98.2 fL (ref 78.0–100.0)
MCV: 98.3 fL (ref 78.0–100.0)
MCV: 98.4 fL (ref 78.0–100.0)
Platelets: 232 10*3/uL (ref 150–400)
Platelets: 243 10*3/uL (ref 150–400)
Platelets: 246 10*3/uL (ref 150–400)
Platelets: 250 10*3/uL (ref 150–400)
RBC: 3.13 MIL/uL — ABNORMAL LOW (ref 4.22–5.81)
RBC: 3.19 MIL/uL — ABNORMAL LOW (ref 4.22–5.81)
RBC: 3.3 MIL/uL — ABNORMAL LOW (ref 4.22–5.81)
RDW: 12.2 % (ref 11.5–15.5)
RDW: 12.7 % (ref 11.5–15.5)
RDW: 12.9 % (ref 11.5–15.5)
RDW: 13.2 % (ref 11.5–15.5)
WBC: 5.8 10*3/uL (ref 4.0–10.5)
WBC: 7 10*3/uL (ref 4.0–10.5)
WBC: 7.5 10*3/uL (ref 4.0–10.5)

## 2010-05-20 LAB — HEMOCCULT GUIAC POC 1CARD (OFFICE): Fecal Occult Bld: NEGATIVE

## 2010-05-20 LAB — HEMOGLOBIN A1C
Hgb A1c MFr Bld: 6.5 % — ABNORMAL HIGH (ref <5.7)
Mean Plasma Glucose: 140 mg/dL — ABNORMAL HIGH (ref <117)

## 2010-05-20 LAB — PHOSPHORUS: Phosphorus: 1.9 mg/dL — ABNORMAL LOW (ref 2.3–4.6)

## 2010-05-20 LAB — LIPASE, BLOOD: Lipase: 21 U/L (ref 11–59)

## 2010-06-10 LAB — URINALYSIS, ROUTINE W REFLEX MICROSCOPIC
Glucose, UA: NEGATIVE mg/dL
Ketones, ur: 40 mg/dL — AB
pH: 5 (ref 5.0–8.0)

## 2010-06-10 LAB — COMPREHENSIVE METABOLIC PANEL
ALT: 30 U/L (ref 0–53)
Alkaline Phosphatase: 75 U/L (ref 39–117)
CO2: 27 mEq/L (ref 19–32)
GFR calc non Af Amer: >60 mL/min (ref >60)
Glucose, Bld: 180 mg/dL — ABNORMAL HIGH (ref 70–99)
Potassium: 4.3 mEq/L (ref 3.5–5.1)
Sodium: 138 mEq/L (ref 135–145)
Total Bilirubin: 1 mg/dL (ref 0.3–1.2)

## 2010-06-10 LAB — URINE CULTURE

## 2010-06-10 LAB — URINE MICROSCOPIC-ADD ON

## 2010-06-10 LAB — CBC
Hemoglobin: 14.3 g/dL (ref 13.0–17.0)
RBC: 4.34 MIL/uL (ref 4.22–5.81)

## 2010-06-10 LAB — HEMOCCULT GUIAC POC 1CARD (OFFICE): Fecal Occult Bld: POSITIVE

## 2010-06-10 LAB — LIPASE, BLOOD: Lipase: 13 U/L (ref 11–59)

## 2010-07-21 NOTE — Discharge Summary (Signed)
NAME:  Tony Moreno, Tony Moreno                       ACCOUNT NO.:  [id number]   MEDICAL RECORD NO.:  [id number]                   PATIENT TYPE:  INP   LOCATION:  0466                                 FACILITY:  [facility]   PHYSICIAN:  Madlyn Frankel. Charlann Boxer, M.D.               DATE OF BIRTH:  06-Jul-1920   DATE OF ADMISSION:  06/06/2003  DATE OF DISCHARGE:  06/14/2003                                 DISCHARGE SUMMARY   ADMISSION DIAGNOSES:  1. Right peri-implant femur fracture.  2. Type 2 diabetes mellitus.  3. Osteoporosis.   DISCHARGE DIAGNOSES:  1. Right peri-implant femur fracture, status post open reduction internal     fixation.  2. Type 2 diabetes mellitus.  3. Osteoporosis.  4. Postoperative hemorrhagic anemia, stable at time of discharge.   PROCEDURE:  The patient was taken to the operating room on June 07, 2003,  and underwent ORIF of a right peri-implant femur fracture with strut graft  and cables.  Surgeon was Dr. Durene Romans, assistant was Clarene Reamer,  P.A.-C.  Surgery was done under general anesthesia and Hemovac drain x1 was  placed at the time of surgery.   CONSULTATIONS:  1. Physical therapy.  2. Occupational therapy.  3. Social work case Insurance account manager.  4. Physical medicine rehabilitation, Dr. Thomasena Edis.   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  Significant past medical history for open reduction  internal fixation to the right proximal femur back in 1971, by Dr. Trellis Paganini.  Hardware remained intact in his legs.  He subsequently stumbled over a dog  on June 06, 2003, injuring his right femur.  He had immediate onset of pain,  was not able to ambulate, and was subsequently brought to the [facility]  Emergency Department.  X-rays noted that he had a peri-implant femur  fracture.  Dr. Charlann Boxer felt it was best to admit the patient and proceed with  open reduction internal fixation of the femur.  The patient and his wife  agreed.   The risks and benefits of the surgery were discussed with the  patient, and they wished to proceed.   LABORATORY DATA:  CBC on admission showed a hemoglobin of 13.5, hematocrit  39.9, white blood cell count 6.3, red blood cell count 4.22.  Serial H&H  were followed throughout the hospital stay.  Hemoglobin and hematocrit did  decline to 10.7 and 31.5, on June 10, 2003, but were stable at the time of  discharge.  Differential on admission was all within normal limits.  Coagulation studies on admission were within normal limits.  PT/INR at the  time of discharge were 13.3 and 1.  Routine chemistries on admission showed  a glucose high at 172.  Serial chemistries were followed throughout hospital  stay.  Sodium declined to 133 on June 08, 2003, was back up to 137 the  following day, and it  was slightly low at 134 on June 10, 2003, it was  stable at the time of discharge.  Glucose ranged from a low of 172 on  admission to a high of 211 on June 09, 2003.  The patient's blood type is A  positive and antibody screen negative.  EKG on admission showed normal sinus  rhythm and normal ECG.  Preoperative x-rays of the sternum revealed no  evidence of sternal fracture.  Preoperative x-rays of the pelvis revealed no  evidence of pelvic fracture.  Preoperative x-rays of the right femur  revealed acute transverse fracture of the proximal femoral shaft of the mid-  portion of lateral fixation plate.  Poor fixation through the long distal  aspect of the plate was broken.  There was mild to moderate medial  angulation of the distal femoral shaft.  Postoperative x-rays on June 07, 2003, revealed open repair of right femoral shaft fracture.   HOSPITAL COURSE:  The patient was admitted to [facility] and taken  to the operating room.  He underwent the above stated procedure.  The  patient tolerated the procedure well, and there were no complications.  He  was allowed to return to the recovery room  and then the orthopaedic floor  for continued postoperative care.  On June 08, 2003, the patient was resting  comfortably, complaining of some nausea.  Hemoglobin and hematocrit were  12.8 and 36.6.  He was neurovascularly intact to the right lower extremity.  He was working with physical therapy and occupational therapy touchdown  weightbearing to the right lower extremity.  He had Lovenox for DVT  prophylaxis.  On June 09, 2003, postoperative day #2, temperature max was  101.2.  The patient was resting fairly comfortably.  Neurovascularly intact  to the right lower extremity.  Incision was clean, dry, and intact.  The  patient continued working with physical therapy and occupational therapy.  PCA was discontinued on this day and IV was also hep-locked.  On June 10, 2003, postoperative day #3, the patient was resting comfortably with  temperature max of 99.5.  He continued to work with physical therapy and  occupational therapy.  IV was discontinued.  On June 11, 2003, postoperative  day #4, the patient was doing well, no complaints.  Hematocrit was 31.  He  was neurovascularly intact to the right lower extremity.  Incision was  clean, dry, and intact.  The patient was hopeful for rehab.  However, it  turns out that the insurance would not approve rehab stay.  On June 12, 2003, postoperative day #5, the patient was without complaints.  Incision  was clean, dry, and intact.  He continued working on physical therapy and  occupational therapy due to unable to go to rehab.  On June 13, 2003,  postoperative day #6, the patient was doing well, progressing fairly well  with physical therapy now.  Incision was clean, dry, and intact.  He was  neurovascularly intact to the right lower extremity.  We will plan to  discharge to home and home health physical therapy and occupational therapy.  The following day, June 14, 2003, postoperative day #7, the patient was doing well, ready for discharge.   Incision was clean, dry, and intact.  He  was neurovascularly intact to the right lower extremity.  The patient will  be discharged home on this day.   DISPOSITION:  The patient is discharged home on June 14, 2003.   DISCHARGE MEDICATIONS:  1. Vicodin one to two p.o. q.4-6h. p.r.n. pain.  2. Robaxin 500 mg one p.o. q.6h. p.r.n. spasm.  3. Aspirin 325 mg one p.o. daily.   DIET:  Previous diet as tolerated.   ACTIVITY:  The patient is touchdown weightbearing to the right lower  extremity.  Will have [facility] for home care.   WOUND CARE:  The patient is to have daily dressing changes performed until  no drainage.  He may shower when no drainage.   FOLLOWUP:  The patient is to follow up two weeks from the date of surgery.  The office is to be called for an appointment at [phone].   CONDITION ON DISCHARGE:  Stable and improved.    Clarene Reamer, P.A.-C.                   Madlyn Frankel Charlann Boxer, M.D.   SW/MEDQ  D:  07/06/2003  T:  07/06/2003  Job:  [id number]

## 2010-07-21 NOTE — Op Note (Signed)
NAME:  Tony Moreno, Tony Moreno                       ACCOUNT NO.:  [id number]   MEDICAL RECORD NO.:  [id number]                   PATIENT TYPE:  INP   LOCATION:  0466                                 FACILITY:  [facility]   PHYSICIAN:  Madlyn Frankel. Charlann Boxer, M.D.               DATE OF BIRTH:  [birth year]   DATE OF PROCEDURE:  06/07/2003  DATE OF DISCHARGE:                                 OPERATIVE REPORT   PREOPERATIVE DIAGNOSES:  Right periimplant proximal femur fracture.   POSTOPERATIVE DIAGNOSES:  Right periimplant proximal femur fracture.   PROCEDURE:  1. Removal of hardware.  2. Open reduction and internal fixation of right proximal periimplant femur     fracture utilizing Synthes 4-0 cortical screws within a retained 10 hole     plate.  A femoral allograft strut placed anterior with five Synthes     cables, three distal and two proximal at the fracture site in addition to     some synthetic demineralized bone matrix substance.   SURGEON:  Madlyn Frankel. Charlann Boxer, M.D.   ASSISTANT:  Clarene Reamer, P.A.-C.   ANESTHESIA:  General.   ESTIMATED BLOOD LOSS:  600 mL   IV FLUIDS:  3 liters LR, 500 normal saline and 1/2 unit of blood.   DRAINS:  Drains x1.   COMPLICATIONS:  None apparent.   INDICATIONS FOR PROCEDURE:  Tony Moreno is a very pleasant 77 year old  gentleman who had a remote history right proximal femur fracture in 1971  that was fixed with an open reduction and internal fixation and a fixed  angled blade plate with a 10 hole plate.  The patient has had a history of  hardware failure on the distal aspect of the plate. On the 3rd of April, he  had stumbled over a pet animal on the floor and sustained a periimplant  fracture. The fracture was basically in the mid portion of the plate. The  proximal portion of the plate was fully fixed within the femoral head and  neck. The fracture pattern was noted to be transverse in nature. Based on  this, the plan was for the fracture to be reduced  to the plate which would  provide a stable construct.  This would be supplemented with an anterior  step graft. After reviewing the risks and benefits, he consents for the  procedure.   DESCRIPTION OF PROCEDURE:  The patient was brought to the operative theatre.  Once adequate anesthesia and preoperative antibiotics, 1 g of Ancef were  administered the patient was positioned supine on the operating table. The  right lower extremity was then prepped and draped in sterile fashion.  A  portion of his previous incision was excised. Sharp dissection was carried  to the iliotibial band. The iliotibial band was incised in the midline. The  vastus lateralis muscle was then carefully peeled off of the iliotibial band  posteriorly to the intermuscular septum and then elevated off  the anterior  aspect of the fracture, proximal femur and distal femur.  With the fracture  site expose, the fracture was identified using curettes and rongeurs.  Under  fluoroscopic guidance, the fracture was reduced using Verbrugge clamps and  the existing plate.  The fracture was essentially reduced anatomically under  direct visualization.  When the Verbrugge clamp was in place, initial  attempt was to try and place large frag screws with initial three tube drill  bit.  After the first attempt at this and realizing that the 4-5 large frag  screws were not going to pass through the existing plate holes rather than  using a small frag screw, we used a 4-0 cortical specialized screw that had  a 3.0 mm inner diameter.  This required a 2.9 drill bit. There were five  exposed holes on the lateral aspect of the plate distal to the fracture  fragment and given a total of 10 cortices distal to the fracture site.  With  this, the Verbrugge clamps were removed and the fracture was stable.  At  this point, attention was directed to preparation of the allograft strut. A  femoral allograft was thawed and then contoured using a  sagittal saw and  bur.  It fit nicely along the anterior surface of the femur.  Five cables  were passed carefully from posterior to anterior.  The three cables were  placed distal to the fracture and two proximal to it.  Once the allograft  had been contoured, an Allomatrix type substance was placed into the wound  around the fracture site and the allograft placed in. The cables were then  tension per protocol for the Synthes cables.  The allograft constructed  appeared very stable with stable cable fixation.  At this point, the wound  was irrigated.  The medium Hemovac drain was placed deep. The vastus  lateralis muscle was allowed to fall posterior within the confines of the  iliotibial band and tensor fascia. This was reapproximated using a #0 Vicryl  in interrupted running fashion.  The subcutaneous layer was reapproximated  using 2-0 Vicryl and skin staples used on the skin. The skin was cleaned,  dried and dressed sterilely with Adaptic dressing sponges, ABD's and tape.  The patient was extubated and taken to the recovery room in stable  condition.   PLAN:  Will allow for the patient touchdown weightbearing based on the  stability of the fracture pattern. He will be on DVT prophylaxis and be seen  by physical therapy and occupational therapy for discharge planning.                                               Madlyn Frankel Charlann Boxer, M.D.    MDO/MEDQ  D:  06/07/2003  T:  06/08/2003  Job:  [id number]

## 2010-07-21 NOTE — H&P (Signed)
NAME:  Tony Moreno, Tony Moreno                       ACCOUNT NO.:  [id number]   MEDICAL RECORD NO.:  [id number]                   PATIENT TYPE:  INP   LOCATION:  0466                                 [facility]   PHYSICIAN:  Madlyn Frankel. Charlann Boxer, M.D.               DATE OF BIRTH:  Jun 25, 1920   DATE OF ADMISSION:  06/06/2003  DATE OF DISCHARGE:                                HISTORY & PHYSICAL   CHIEF COMPLAINT:  Right leg pain.   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 Zelphia Cairo.  The hardware remains in his leg at this time.  He subsequently stumbled over a doll today injuring his right femur, had  immediate onset of pain, was not able to ambulate, was subsequently brought  to the [facility] Emergency Department where it was noted that he had a  periprostatic femur fracture.  Dr. Charlann Boxer felt that it was best to admit the  patient and proceed with open reduction internal fixation of the femur.  The  patient and his wife agree.  The risks and benefits were discussed with the  patient, and the patient wishes to proceed.   ALLERGIES:  No known drug allergies.   MEDICATIONS:  1. Fosamax one p.o. every week which was taken last Tuesday.  2. Multivitamin one p.o. daily.  3. Baby aspirin one p.o. daily.  4. Calcium one p.o. daily.   PAST MEDICAL HISTORY:  1. Type 2 diabetes mellitus, controlled by diet and exercise.  2. Osteoporosis.   PAST SURGICAL HISTORY:  1. Subtotal gastrectomy and cholecystectomy done in 1960.  2. Appendectomy in 1961.  3. Left wrist surgery x2.  4. ORIF of right proximal femur in 1971.   SOCIAL HISTORY:  The patient denies any tobacco use, has three glasses of  wine per day.  He is married, lives in a two story house with 13 steps in  the house.   FAMILY  HISTORY:  Father diabetes mellitus and cerebrovascular accident,  deceased.  Mother unremarkable, however, deceased.   REVIEW OF SYSTEMS:  Not obtained due to the patient's grogginess after  having just received medications.   PHYSICAL EXAMINATION:  VITAL SIGNS:  Temperature 97.8, pulse 76,  respirations 16, blood pressure 117/64.  GENERAL:  A well-developed, well-nourished 77 year old male.  NECK:  Supple, no carotid bruit noted.  CHEST:  Clear to auscultation bilaterally.  No wheezes or crackles.  HEART:  Regular rate and rhythm, no murmurs, rubs, or gallops.  ABDOMEN:  Soft, nontender, nondistended, positive bowel sounds x4.  EXTREMITIES:  Right lower extremity is shortened in the angular with  palpable deformity versus plate.  He is neurovascularly intact to the right  lower extremity.  SKIN:  No  rashes or lesions.  NEUROLOGIC:  Alert and oriented x3.   LABORATORY DATA:  X-ray reveals periprosthetic right femur fracture.  Hematocrit is 39.  BMET:  Sodium 139, potassium 4, glucose 172, BUN is 23,  creatinine is 1.  INR is 0.9.   IMPRESSION:  1. Right periprosthetic femur fracture.  2. Type 2 diabetes mellitus.  3. Osteoporosis.   PLAN:  The patient will be admitted to [facility] and taken to the  operating room later this evening and undergo an open reduction internal  fixation to the right femur fracture by Dr. Durene Romans.     Clarene Reamer, P.A.-C.                   Madlyn Frankel Charlann Boxer, M.D.    SW/MEDQ  D:  06/06/2003  T:  06/06/2003  Job:  [id number]

## 2010-07-27 ENCOUNTER — Ambulatory Visit (HOSPITAL_COMMUNITY): Payer: Medicare Other | Attending: Internal Medicine

## 2010-07-27 DIAGNOSIS — M810 Age-related osteoporosis without current pathological fracture: Secondary | ICD-10-CM | POA: Insufficient documentation

## 2010-10-17 ENCOUNTER — Other Ambulatory Visit: Payer: Self-pay | Admitting: Oncology

## 2010-10-17 ENCOUNTER — Encounter (HOSPITAL_BASED_OUTPATIENT_CLINIC_OR_DEPARTMENT_OTHER): Payer: Medicare Other | Admitting: Oncology

## 2010-10-17 DIAGNOSIS — C189 Malignant neoplasm of colon, unspecified: Secondary | ICD-10-CM

## 2010-10-17 LAB — COMPREHENSIVE METABOLIC PANEL
ALT: 44 U/L (ref 0–53)
Alkaline Phosphatase: 79 U/L (ref 39–117)
Creatinine, Ser: 1.13 mg/dL (ref 0.50–1.35)
Sodium: 136 mEq/L (ref 135–145)
Total Bilirubin: 0.5 mg/dL (ref 0.3–1.2)
Total Protein: 6.9 g/dL (ref 6.0–8.3)

## 2010-10-17 LAB — CBC WITH DIFFERENTIAL/PLATELET
BASO%: 0.2 % (ref 0.0–2.0)
EOS%: 0.6 % (ref 0.0–7.0)
MCH: 33.9 pg — ABNORMAL HIGH (ref 27.2–33.4)
MCHC: 34.6 g/dL (ref 32.0–36.0)
NEUT%: 62.2 % (ref 39.0–75.0)
RDW: 13.1 % (ref 11.0–14.6)
lymph#: 1.8 10*3/uL (ref 0.9–3.3)

## 2010-10-23 ENCOUNTER — Encounter (HOSPITAL_BASED_OUTPATIENT_CLINIC_OR_DEPARTMENT_OTHER): Payer: Medicare Other | Admitting: Oncology

## 2011-02-13 ENCOUNTER — Other Ambulatory Visit: Payer: Self-pay | Admitting: Orthopedic Surgery

## 2011-02-13 DIAGNOSIS — M5137 Other intervertebral disc degeneration, lumbosacral region: Secondary | ICD-10-CM

## 2011-02-15 ENCOUNTER — Other Ambulatory Visit: Payer: Medicare Other

## 2011-02-19 ENCOUNTER — Other Ambulatory Visit: Payer: Medicare Other

## 2011-03-05 ENCOUNTER — Ambulatory Visit
Admission: RE | Admit: 2011-03-05 | Discharge: 2011-03-05 | Disposition: A | Discharge status: Home / Self Care | Payer: Medicare Other | Source: Ambulatory Visit | Attending: Orthopedic Surgery | Admitting: Orthopedic Surgery

## 2011-03-05 DIAGNOSIS — M5137 Other intervertebral disc degeneration, lumbosacral region: Secondary | ICD-10-CM

## 2011-03-24 ENCOUNTER — Telehealth: Payer: Self-pay | Admitting: Oncology

## 2011-03-24 NOTE — Telephone Encounter (Signed)
called pts home s/w wife and schedule appt for [phone]

## 2011-04-02 ENCOUNTER — Encounter (INDEPENDENT_AMBULATORY_CARE_PROVIDER_SITE_OTHER): Payer: Self-pay

## 2011-04-27 ENCOUNTER — Telehealth: Payer: Self-pay | Admitting: Oncology

## 2011-04-27 NOTE — Telephone Encounter (Signed)
S/w trhe pt's wife and she is aware of the r's appts from 05/02/2011 to 05/07/2011

## 2011-05-02 ENCOUNTER — Ambulatory Visit: Payer: Medicare Other | Admitting: Oncology

## 2011-05-02 ENCOUNTER — Other Ambulatory Visit: Payer: Medicare Other | Admitting: Lab

## 2011-05-04 ENCOUNTER — Ambulatory Visit: Payer: Medicare Other | Admitting: Physician Assistant

## 2011-05-04 ENCOUNTER — Other Ambulatory Visit: Payer: Medicare Other | Admitting: Lab

## 2011-05-07 ENCOUNTER — Other Ambulatory Visit (HOSPITAL_BASED_OUTPATIENT_CLINIC_OR_DEPARTMENT_OTHER): Payer: Medicare Other | Admitting: Lab

## 2011-05-07 ENCOUNTER — Telehealth: Payer: Self-pay | Admitting: Oncology

## 2011-05-07 ENCOUNTER — Ambulatory Visit (HOSPITAL_BASED_OUTPATIENT_CLINIC_OR_DEPARTMENT_OTHER): Payer: Medicare Other | Admitting: Physician Assistant

## 2011-05-07 ENCOUNTER — Encounter: Payer: Self-pay | Admitting: Physician Assistant

## 2011-05-07 VITALS — BP 123/73 | HR 90 | Temp 98.6°F | Ht 67.0 in | Wt 149.1 lb

## 2011-05-07 DIAGNOSIS — E785 Hyperlipidemia, unspecified: Secondary | ICD-10-CM

## 2011-05-07 DIAGNOSIS — G629 Polyneuropathy, unspecified: Secondary | ICD-10-CM

## 2011-05-07 DIAGNOSIS — K219 Gastro-esophageal reflux disease without esophagitis: Secondary | ICD-10-CM | POA: Insufficient documentation

## 2011-05-07 DIAGNOSIS — N400 Enlarged prostate without lower urinary tract symptoms: Secondary | ICD-10-CM | POA: Insufficient documentation

## 2011-05-07 DIAGNOSIS — E119 Type 2 diabetes mellitus without complications: Secondary | ICD-10-CM | POA: Insufficient documentation

## 2011-05-07 DIAGNOSIS — M810 Age-related osteoporosis without current pathological fracture: Secondary | ICD-10-CM | POA: Insufficient documentation

## 2011-05-07 DIAGNOSIS — C189 Malignant neoplasm of colon, unspecified: Secondary | ICD-10-CM

## 2011-05-07 HISTORY — DX: Gastro-esophageal reflux disease without esophagitis: K21.9

## 2011-05-07 HISTORY — DX: Benign prostatic hyperplasia without lower urinary tract symptoms: N40.0

## 2011-05-07 HISTORY — DX: Hyperlipidemia, unspecified: E78.5

## 2011-05-07 HISTORY — DX: Type 2 diabetes mellitus without complications: E11.9

## 2011-05-07 HISTORY — DX: Polyneuropathy, unspecified: G62.9

## 2011-05-07 HISTORY — DX: Malignant neoplasm of colon, unspecified: C18.9

## 2011-05-07 LAB — COMPREHENSIVE METABOLIC PANEL
Albumin: 3.8 g/dL (ref 3.5–5.2)
Alkaline Phosphatase: 83 U/L (ref 39–117)
BUN: 21 mg/dL (ref 6–23)
CO2: 26 mEq/L (ref 19–32)
Glucose, Bld: 173 mg/dL — ABNORMAL HIGH (ref 70–99)
Potassium: 4.4 mEq/L (ref 3.5–5.3)
Total Protein: 7.1 g/dL (ref 6.0–8.3)

## 2011-05-07 LAB — CBC WITH DIFFERENTIAL/PLATELET
Basophils Absolute: 0 10*3/uL (ref 0.0–0.1)
Eosinophils Absolute: 0.1 10*3/uL (ref 0.0–0.5)
HGB: 13 g/dL (ref 13.0–17.1)
LYMPH%: 20.7 % (ref 14.0–49.0)
MCV: 98.1 fL — ABNORMAL HIGH (ref 79.3–98.0)
MONO#: 0.9 10*3/uL (ref 0.1–0.9)
MONO%: 11 % (ref 0.0–14.0)
NEUT#: 5.3 10*3/uL (ref 1.5–6.5)
Platelets: 223 10*3/uL (ref 140–400)
RDW: 12.6 % (ref 11.0–14.6)
WBC: 7.9 10*3/uL (ref 4.0–10.3)

## 2011-05-07 LAB — CEA: CEA: 4.1 ng/mL (ref 0.0–5.0)

## 2011-05-07 NOTE — Progress Notes (Signed)
Hematology and Oncology Follow Up Visit  VI WHITESEL [id number] Jun 20, 1920 78 y.o. 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 On observation alone.  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 He voices no complaints today, specifically no unexplained fevers, chills, night sweats, shortness of breath, or chest pain. He has had a prior gastrectomy in the past, so he notes that he has early satiety, but denies any nausea, or emesis issues per se. His appetite itself is fine. He has not noted any diarrhea, constipation, hematochezia or melena stools. He does ambulate with a walker. He denies any significant change in his peripheral neuropathy. He has not sustained any falls since his last office visit 6 months ago. A detailed review of systems is otherwise noncontributory as noted below.  Review of Systems: Constitutional:  no weight loss, fever, night sweats and feels well Eyes: blurry vision and uses glasses ENT: hard of hearing bilaterally. Cardiovascular: no chest pain or dyspnea on exertion Respiratory: no cough, shortness of breath, or wheezing Neurological: no TIA or stroke symptoms Dermatological: negative Gastrointestinal: no abdominal pain, change in bowel habits, or black or bloody stools Genito-Urinary: no dysuria, trouble voiding, or hematuria Hematological and Lymphatic: negative Breast: negative Musculoskeletal: negative Remaining ROS negative.   Medications:   I have reviewed the patient's current medications.  Current Outpatient Prescriptions  Medication Sig Dispense Refill  . aspirin 81 MG tablet Take 81 mg by mouth daily.      Marland Kitchen buPROPion (WELLBUTRIN) 100 MG tablet Take 100 mg by mouth daily.      .  famotidine (PEPCID) 20 MG tablet Take 20 mg by mouth 2 (two) times daily.      . finasteride (PROSCAR) 5 MG tablet Take 5 mg by mouth daily.      . metFORMIN (GLUCOPHAGE) 500 MG tablet Take 500 mg by mouth 2 (two) times daily with a meal.      . metoCLOPramide (REGLAN) 10 MG tablet Take 10 mg by mouth 4 (four) times daily as needed.        Allergies: No Known Allergies  Physical Exam: Filed Vitals:   05/07/11 1524  BP: 123/73  Pulse: 90  Temp: 98.6 F (37 C)    Body mass index is 23.35 kg/(m^2). Weight: 149 lbs. HEENT:  Sclerae anicteric, conjunctivae pink.  Oropharynx clear.  No mucositis or candidiasis.   Nodes:  No cervical, supraclavicular, or axillary lymphadenopathy palpated.  Lungs:  Clear to auscultation bilaterally.  No crackles, rhonchi, or wheezes. Evidence of mild kyphosis. Heart:  Regular rate and rhythm.   Abdomen:  Soft, nontender.  Positive bowel sounds.  No organomegaly or masses palpated.  No evidence of umbilical masses prior surgical sites are benign. Musculoskeletal:  No focal spinal tenderness to palpation.  Extremities:  Benign.  No peripheral edema or cyanosis.   Skin:  Benign.   Neuro:  Nonfocal, essentially alert and oriented x 3.  Lab Results: Lab Results  Component Value Date   WBC 7.9 05/07/2011   HGB 13.0 05/07/2011   HCT 38.7 05/07/2011   MCV 98.1* 05/07/2011   PLT 223 05/07/2011   NEUTROABS 5.3 05/07/2011     Chemistry      Component Value Date/Time   NA  136 10/17/2010 1432   NA 136 10/17/2010 1432   NA 136 10/17/2010 1432   K 4.4 10/17/2010 1432   K 4.4 10/17/2010 1432   K 4.4 10/17/2010 1432   CL 105 10/17/2010 1432   CL 105 10/17/2010 1432   CL 105 10/17/2010 1432   CO2 23 10/17/2010 1432   CO2 23 10/17/2010 1432   CO2 23 10/17/2010 1432   BUN 23 10/17/2010 1432   BUN 23 10/17/2010 1432   BUN 23 10/17/2010 1432   CREATININE 1.13 10/17/2010 1432   CREATININE 1.13 10/17/2010 1432   CREATININE 1.13 10/17/2010 1432      Component Value Date/Time   CALCIUM  9.3 10/17/2010 1432   CALCIUM 9.3 10/17/2010 1432   CALCIUM 9.3 10/17/2010 1432   ALKPHOS 79 10/17/2010 1432   ALKPHOS 79 10/17/2010 1432   ALKPHOS 79 10/17/2010 1432   AST 28 10/17/2010 1432   AST 28 10/17/2010 1432   AST 28 10/17/2010 1432   ALT 44 10/17/2010 1432   ALT 44 10/17/2010 1432   ALT 44 10/17/2010 1432   BILITOT 0.5 10/17/2010 1432   BILITOT 0.5 10/17/2010 1432   BILITOT 0.5 10/17/2010 1432      No results found for this basename: LABCA2    Radiological Studies: 03/05/11 CT LUMBAR SPINE WITHOUT CONTRAST  Technique: Multidetector CT imaging of the lumbar spine was  performed without intravenous contrast administration. Multiplanar  CT image reconstructions were also generated.  Comparison: CT of the abdomen pelvis 09/28/2009.  Findings: An ill-defined sclerotic lesion within the posterior-  superior aspect of the L2 vertebral body has enlarged slightly. It  previously measured 10 x 11 x 12 mm. The lesion now measures 12 x  13 x 13 mm. There is no associated fracture or extra osseous  extension. The lesion does extend to the cortex as before. A 4 mm  anterior left sclerotic lesion is present at L4. A 9.5 mm  sclerotic lesion on the left at S2 is similar to the prior study.  An additional lower sacral sclerotic lesion on the left is  incompletely imaged, but appears similar to the prior study as  well. Mild degenerative retrolisthesis is present at L1-2 and L2-3.  There is significant loss of disc height across all levels of the  lumbar spine. Chronic endplate sclerotic changes are present at  T12-L1, L1-2, and L2-3, particularly on the right secondary to mild  leftward curvature. T12-L1: A broad-based disc herniation is associated with the  osteophyte formation. The mild facet hypertrophy is noted as well.  There is no focal stenosis. L1-2: Broad-based disc herniation is asymmetric to the left. This  results in mild left foraminal narrowing. There may be some  narrowing of  the left lateral recess as well. Facet hypertrophy  contributes. L2-3: Moderate facet hypertrophy is present. There is near total  loss of disc height and slight retrolisthesis. Mild foraminal  narrowing is worse on the left. L3-4: Moderate facet hypertrophy is present. This results in  osseous foraminal narrowing, worse on the right. There is mild  central canal narrowing as well. L4-5: Advanced facet hypertrophy is present. Rightward endplate spurring is noted. Mild foraminal narrowing is worse on the right.  L5-S1: A rightward disc herniation is present. This extends into  the right foramen with mild foraminal narrowing. There is  significant loss disc height. IMPRESSION: 1. Slight increase in size of a dominant sclerotic lesion on the right at L2, concerning for metastatic disease. Additional lesions  are present at L4, S2, and S3. 2. Multilevel spondylosis of the lumbar spine as described above.  Original Report Authenticated By: Jamesetta Orleans. MATTERN, M.D.      CT ABDOMEN WITHOUT CONTRAST 03/05/2011:  Technique: Multidetector CT imaging of the abdomen was performed following the standard protocol without IV contrast. Oral contrast was administered. Comparison: MRI lumbar spine 05/01/2006 [facility]. CT abdomen and pelvis 10/31/2008, 09/28/2009 [facility]. Findings: Stable appearance of the left adrenal gland, with a probable adenoma in its medial limb measuring approximately 2.2 x 0.8 cm (series 2, image 36) and a probable adenoma in its lateral limb measuring approximately 1.1 x 2.6 cm (image 38). Stable  normal-appearing right adrenal gland. Stable multiple hepatic cysts; within the limits of the unenhanced technique, no solid liver lesion. Normal unenhanced appearance of the spleen. Diffuse pancreatic atrophy with fatty infiltration, unchanged. Non-obstructing approximate 3 mm calculus in a and upper  pole calix of the right kidney. Within the limb of the unenhanced    technique, no focal parenchymal abnormality involving either  kidney. No hydronephrosis. Moderate atherosclerosis involving the abdominal aorta without aneurysm. Small hiatal hernia. Stomach otherwise unremarkable. Small bowel normal in appearance. Interval left hemicolectomy since the prior  CT. Ascending colon and cecum unremarkable, with the cecum  extending to the midline in the periumbilical region. No ascites.  Bone window images demonstrate degenerative changes involving the lower thoracic and lumbar spine and generalized osteopenia (please see the report of the lumbar imaging reported separately). Tree in bud opacities in the lower lobes, left greater than right. Heart size normal with prominent epicardial fat and aortic annular calcification.  IMPRESSION:  1. 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  2. No acute abnormalities involving the abdomen or pelvis.  3. Small hiatal hernia.  4. Pancreatic atrophy.  5. Hepatic cysts.  6. Tree in bud opacities in the lower lobes, left greater than  right, query inflammation versus infection.  7. Non-obstructing approximate 3 mm right upper pole renal  calculus.  Original Report Authenticated By: Arnell Sieving, M.D.    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 On observation alone, without evidence of recurrent or metastatic disease a serum chemistries and CEA are pending from today.  Case has been reviewed with Dr. Pierce Crane who also reviewed reports from the CT of the abdomen and CT of the L-spine.   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 Waynard Edwards has obtained these studies prior. Both Dr. Lorie Phenix and his wife know they contact us prior to  his six-month followup if the need should arise.   This plan was reviewed with the patient, who voices understanding and agreement.  She knows to call with any changes or problems.    Zyionna Pesce T, PA-C 05/07/2011

## 2011-05-07 NOTE — Telephone Encounter (Signed)
gve the pt his sept 2013 appt calendar °

## 2011-08-01 ENCOUNTER — Encounter (HOSPITAL_COMMUNITY)
Admission: RE | Admit: 2011-08-01 | Discharge: 2011-08-01 | Disposition: A | Discharge status: Home / Self Care | Payer: Medicare Other | Source: Ambulatory Visit | Attending: Internal Medicine | Admitting: Internal Medicine

## 2011-08-01 DIAGNOSIS — M810 Age-related osteoporosis without current pathological fracture: Secondary | ICD-10-CM | POA: Insufficient documentation

## 2011-08-01 MED ORDER — SODIUM CHLORIDE 0.9 % IV SOLN
INTRAVENOUS | Status: AC
  Administered 2011-08-01: 17:00:00 via INTRAVENOUS

## 2011-08-01 MED ORDER — ZOLEDRONIC ACID 5 MG/100ML IV SOLN
5.0000 mg | Freq: Once | INTRAVENOUS | Status: AC
  Administered 2011-08-01: 5 mg via INTRAVENOUS
  Filled 2011-08-01: qty 100

## 2011-08-01 NOTE — Progress Notes (Signed)
Pt tolerated Reclast infusion.  Uneventful.  Pt has no complaints. Pt discharged with wife.

## 2011-11-15 ENCOUNTER — Ambulatory Visit: Payer: Medicare Other | Admitting: Oncology

## 2011-11-15 ENCOUNTER — Other Ambulatory Visit: Payer: Medicare Other | Admitting: Lab

## 2012-02-02 ENCOUNTER — Emergency Department (HOSPITAL_COMMUNITY)
Admission: EM | Admit: 2012-02-02 | Discharge: 2012-02-02 | Disposition: A | Discharge status: Home / Self Care | Payer: Medicare Other | Attending: Emergency Medicine | Admitting: Emergency Medicine

## 2012-02-02 ENCOUNTER — Emergency Department (HOSPITAL_COMMUNITY): Payer: Medicare Other

## 2012-02-02 DIAGNOSIS — Y9389 Activity, other specified: Secondary | ICD-10-CM | POA: Insufficient documentation

## 2012-02-02 DIAGNOSIS — Y929 Unspecified place or not applicable: Secondary | ICD-10-CM | POA: Insufficient documentation

## 2012-02-02 DIAGNOSIS — W19XXXA Unspecified fall, initial encounter: Secondary | ICD-10-CM

## 2012-02-02 DIAGNOSIS — W1809XA Striking against other object with subsequent fall, initial encounter: Secondary | ICD-10-CM | POA: Insufficient documentation

## 2012-02-02 DIAGNOSIS — Z85038 Personal history of other malignant neoplasm of large intestine: Secondary | ICD-10-CM | POA: Insufficient documentation

## 2012-02-02 DIAGNOSIS — N400 Enlarged prostate without lower urinary tract symptoms: Secondary | ICD-10-CM | POA: Insufficient documentation

## 2012-02-02 DIAGNOSIS — E119 Type 2 diabetes mellitus without complications: Secondary | ICD-10-CM | POA: Insufficient documentation

## 2012-02-02 DIAGNOSIS — Z8669 Personal history of other diseases of the nervous system and sense organs: Secondary | ICD-10-CM | POA: Insufficient documentation

## 2012-02-02 DIAGNOSIS — S0100XA Unspecified open wound of scalp, initial encounter: Secondary | ICD-10-CM | POA: Insufficient documentation

## 2012-02-02 DIAGNOSIS — Z8719 Personal history of other diseases of the digestive system: Secondary | ICD-10-CM | POA: Insufficient documentation

## 2012-02-02 DIAGNOSIS — E785 Hyperlipidemia, unspecified: Secondary | ICD-10-CM | POA: Insufficient documentation

## 2012-02-02 DIAGNOSIS — S0101XA Laceration without foreign body of scalp, initial encounter: Secondary | ICD-10-CM

## 2012-02-02 DIAGNOSIS — M810 Age-related osteoporosis without current pathological fracture: Secondary | ICD-10-CM | POA: Insufficient documentation

## 2012-02-02 DIAGNOSIS — Z23 Encounter for immunization: Secondary | ICD-10-CM | POA: Insufficient documentation

## 2012-02-02 DIAGNOSIS — Z79899 Other long term (current) drug therapy: Secondary | ICD-10-CM | POA: Insufficient documentation

## 2012-02-02 DIAGNOSIS — Z7982 Long term (current) use of aspirin: Secondary | ICD-10-CM | POA: Insufficient documentation

## 2012-02-02 MED ORDER — TETANUS-DIPHTH-ACELL PERTUSSIS 5-2.5-18.5 LF-MCG/0.5 IM SUSP
0.5000 mL | Freq: Once | INTRAMUSCULAR | Status: AC
  Administered 2012-02-02: 0.5 mL via INTRAMUSCULAR
  Filled 2012-02-02: qty 0.5

## 2012-02-02 NOTE — ED Notes (Signed)
Pt present with neck immobilizer and spinal board present

## 2012-02-02 NOTE — ED Notes (Signed)
Bed:WA08<BR> Expected date:<BR> Expected time:<BR> Means of arrival:<BR> Comments:<BR> Hold for EMS

## 2012-02-02 NOTE — ED Notes (Signed)
Pt verbalizes understanding 

## 2012-02-02 NOTE — ED Provider Notes (Signed)
History     CSN: [id number]  Arrival date & time 02/02/12  2025   First MD Initiated Contact with Patient 02/02/12 2045      Chief Complaint  Patient presents with  . Fall  . Head Laceration    (Consider location/radiation/quality/duration/timing/severity/associated sxs/prior treatment) HPI Comments: Tony Moreno is a 78 y.o. Male who fell, just prior to arrival, when he bent over to fix that cushion. He struck his head on a cabinet. He was unable to get up from the floor, secondary to his peripheral neuropathy. He denies loss of consciousness, weakness, dizziness, neck pain, back pain, or paresthesias. He denies neck pain. There are no modifying factors. The patient feels that he is at his baseline.   Patient is a 78 y.o. male presenting with fall and scalp laceration. The history is provided by the patient.  Fall  Head Laceration    Past Medical History  Diagnosis Date  . Colon cancer 05/07/2011  . Type 2 diabetes mellitus 05/07/2011  . Peripheral neuropathy 05/07/2011  . GERD (gastroesophageal reflux disease) 05/07/2011  . Osteoporosis 05/07/2011  . BPH (benign prostatic hypertrophy) 05/07/2011  . Hyperlipidemia 05/07/2011    No past surgical history on file.  No family history on file.  History  Substance Use Topics  . Smoking status: Not on file  . Smokeless tobacco: Not on file  . Alcohol Use: Not on file      Review of Systems  All other systems reviewed and are negative.    Allergies  Review of patient's allergies indicates no known allergies.  Home Medications   Current Outpatient Rx  Name  Route  Sig  Dispense  Refill  . ASPIRIN 81 MG PO TABS   Oral   Take 81 mg by mouth daily.         . BUPROPION HCL 100 MG PO TABS   Oral   Take 100 mg by mouth daily.         Marland Kitchen CALCIUM CARBONATE-VITAMIN D 500-200 MG-UNIT PO TABS   Oral   Take 1 tablet by mouth daily.         . CYANOCOBALAMIN 1000 MCG/ML IJ SOLN   Intramuscular   Inject 1,000 mcg into  the muscle every 14 (fourteen) days.         Marland Kitchen FAMOTIDINE 20 MG PO TABS   Oral   Take 20 mg by mouth 2 (two) times daily.         Marland Kitchen FINASTERIDE 5 MG PO TABS   Oral   Take 5 mg by mouth daily.         Marland Kitchen METFORMIN HCL 500 MG PO TABS   Oral   Take 500 mg by mouth 2 (two) times daily with a meal.         . METOCLOPRAMIDE HCL 10 MG PO TABS   Oral   Take 10 mg by mouth 4 (four) times daily as needed.         Marland Kitchen ONE-DAILY MULTI VITAMINS PO TABS   Oral   Take 1 tablet by mouth daily.         Marland Kitchen PRAVASTATIN SODIUM 20 MG PO TABS   Oral   Take 20 mg by mouth daily.           BP 125/67  Pulse 79  Temp 98.2 F (36.8 C) (Oral)  Resp 14  SpO2 100%  Physical Exam  Nursing note and vitals reviewed. Constitutional: He is oriented to person,  place, and time. He appears well-developed and well-nourished.       Elderly, frail, calm, and conversive  HENT:  Head: Normocephalic.  Right Ear: External ear normal.  Left Ear: External ear normal.       Gaping  vertex laceration of the scalp.  Eyes: Conjunctivae normal and EOM are normal. Pupils are equal, round, and reactive to light.  Neck: Normal range of motion and phonation normal. Neck supple.  Cardiovascular: Normal rate, regular rhythm, normal heart sounds and intact distal pulses.   Pulmonary/Chest: Effort normal and breath sounds normal. He has no rales. He exhibits no tenderness and no bony tenderness.  Abdominal: Soft. Normal appearance. There is no tenderness. There is no guarding.  Musculoskeletal: Normal range of motion.  Neurological: He is alert and oriented to person, place, and time. He has normal strength. No cranial nerve deficit or sensory deficit. He exhibits normal muscle tone. Coordination normal.  Skin: Skin is warm, dry and intact.  Psychiatric: He has a normal mood and affect. His behavior is normal. Judgment and thought content normal.    ED Course  Procedures (including critical care  time)  Emergency department treatment: Tetanus booster and laceration closure  LACERATION REPAIR Performed by: Flint Melter Consent: Verbal consent obtained. Risks and benefits: risks, benefits and alternatives were discussed Patient identity confirmed: provided demographic data Time out performed prior to procedure Prepped and Draped in normal sterile fashion Wound explored Laceration Location: vertex scalp Laceration Length: 4.5 cm No Foreign Bodies seen or palpated Anesthesia: local infiltration Local anesthetic: lidocaine 1% no epinephrine Anesthetic total: 4 ml Irrigation method: syringe Amount of cleaning: standard Skin closure: suture Number of sutures or staples: 5 Technique: simple Patient tolerance: Patient tolerated the procedure well with no immediate complications.  Labs Reviewed - No data to display Ct Head Wo Contrast  02/02/2012  *RADIOLOGY REPORT*  Clinical Data: Status post fall; laceration to the head.  CT HEAD WITHOUT CONTRAST  Technique:  Contiguous axial images were obtained from the base of the skull through the vertex without contrast.  Comparison: CT of the head performed 10/07/2009  Findings: There is no evidence of acute infarction, mass lesion, or intra- or extra-axial hemorrhage on CT.  Prominence of the ventricles and sulci reflects moderate cortical volume loss.  Cerebellar atrophy is noted.  Scattered periventricular and subcortical white matter change likely reflects small vessel ischemic microangiopathy.  The brainstem and fourth ventricle are within normal limits.  The basal ganglia are unremarkable in appearance.  The cerebral hemispheres demonstrate grossly normal gray-white differentiation. No mass effect or midline shift is seen.  There is no evidence of fracture; visualized osseous structures are unremarkable in appearance.  The orbits are within normal limits. The paranasal sinuses and mastoid air cells are well-aerated.  No significant soft  tissue abnormalities are seen.  IMPRESSION:  1.  No evidence of traumatic intracranial injury or fracture. 2.  Moderate cortical volume loss and scattered small vessel ischemic microangiopathy.   Original Report Authenticated By: Tonia Ghent, M.D.      1. Fall   2. Laceration of scalp       MDM  Mechanical fall, with scalp laceration. No significant head injury, neck pain, or back pain. Causative factor is likely has peripheral neuropathy, and gait instability. Doubt metabolic instability, serious bacterial infection or impending vascular collapse; the patient is stable for discharge.      Plan: Home Medications- usual; Home Treatments- rest, wound care; Recommended follow up- sutures out 8  days      Flint Melter, MD 02/02/12 [phone]

## 2012-02-02 NOTE — ED Notes (Signed)
Pt present from [facility] with wife via EMS.  Pt c/o trying to bend down, lost balance and hit his head on corner of a cabinet.  Pt denies LOC.  Pt denies dizziness or blurry vision.  Pt deneisn/v.  Pt has hs peripheral neurapathy and c/o poor balance at times.  Pt aaox3.  Pt head injury with small laceration noted to  Top of head with controlled bleeding.

## 2012-04-28 IMAGING — CT CT L SPINE W/O CM
3 of 10 series · 9 of 27 positions shown, 10 images · non-contrast
Comparison: CT of the abdomen pelvis 09/28/2009.

CLINICAL DATA: Evaluate L2 lesion.

CT LUMBAR SPINE WITHOUT CONTRAST
TECHNIQUE: Multidetector CT imaging of the lumbar spine was
performed without intravenous contrast administration. Multiplanar
CT image reconstructions were also generated.

[Series 2: l spine bone · axial · 0.27mm/px · z∈[+124,+194]mm · 2 of 84 slices shown, 3 images]
[im 28/84  soft-tissue]
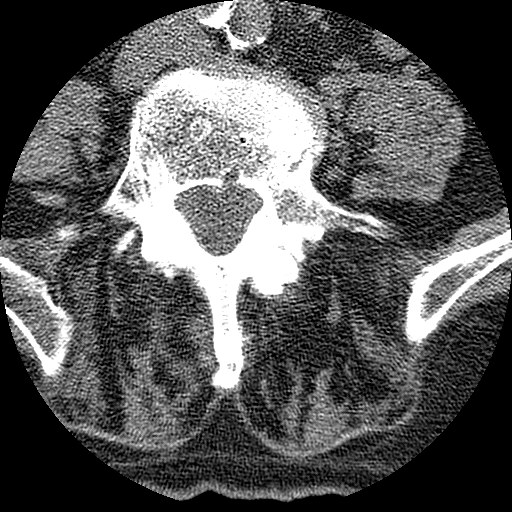
[im 28/84  bone]
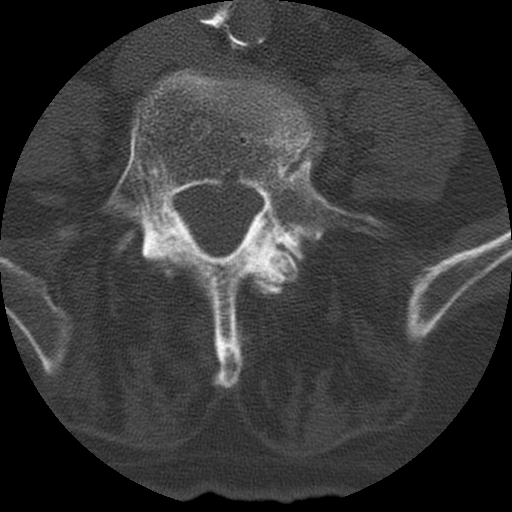
[im 56/84  bone]
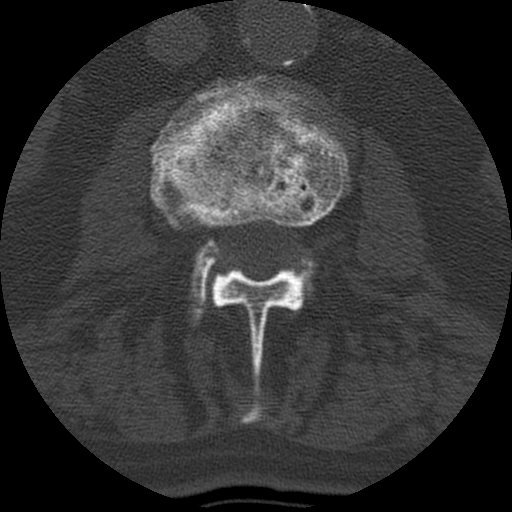

[Series 3: l spine soft · axial · 0.27mm/px · z∈[+124,+194]mm · 2 of 84 slices shown]
[im 28/84  soft-tissue]
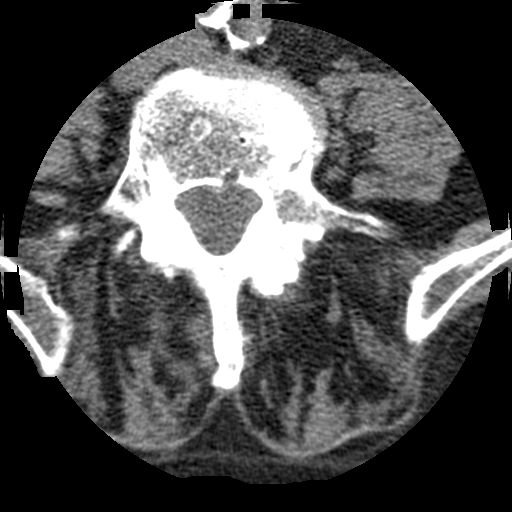
[im 56/84  soft-tissue]
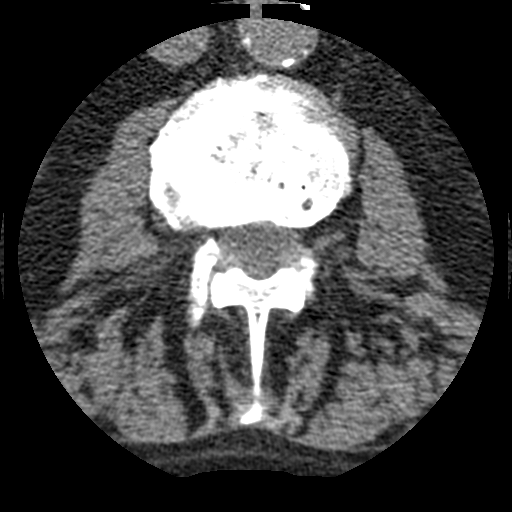

[Series 401: sag · sagittal · 0.42mm/px · 5 of 63 slices shown]
[im 11/63  bone]
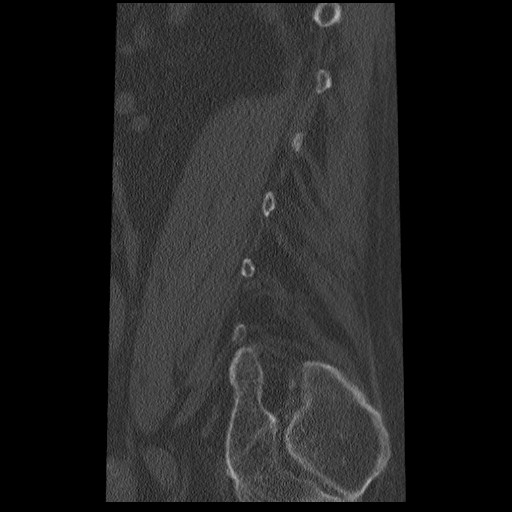
[im 21/63  bone]
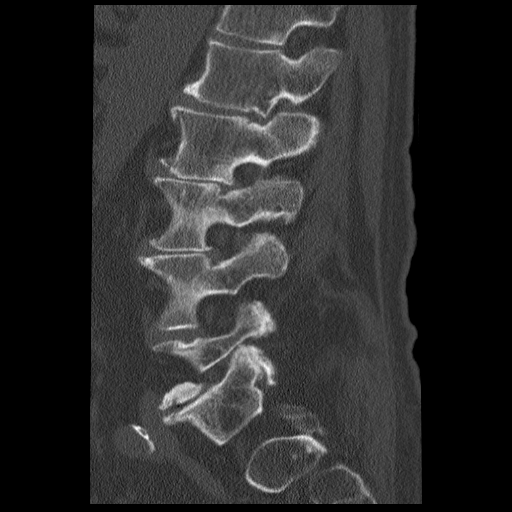
[im 32/63  bone]
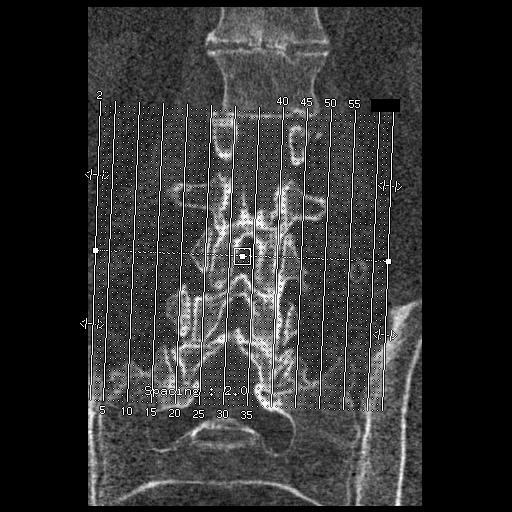
[im 42/63  bone]
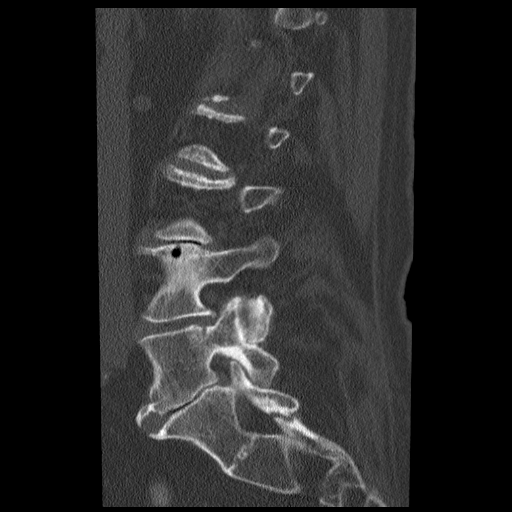
[im 52/63  bone]
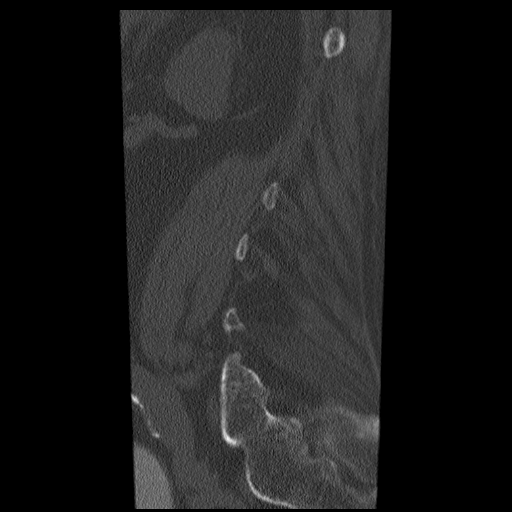

[9 of 27 positions shown; findings below may reference images not displayed]

FINDINGS: An ill-defined sclerotic lesion within the posterior-
superior aspect of the L2 vertebral body has enlarged slightly.  It
previously measured 10 x 11 x 12 mm.  The lesion now measures 12 x
13 x 13 mm.  There is no associated fracture or extra osseous
extension.  The lesion does extend to the cortex as before.  A 4 mm
anterior left sclerotic lesion is present at L4.  A 9.5 mm
sclerotic lesion on the left at S2 is similar to the prior study.
An additional lower sacral sclerotic lesion on the left is
incompletely imaged, but appears similar to the prior study as
well.

Mild degenerative retrolisthesis is present at L1-2 and L2-3.
There is significant loss of disc height across all levels of the
lumbar spine.  Chronic endplate sclerotic changes are present at
T12-L1, L1-2, and L2-3, particularly on the right secondary to mild
leftward curvature.

T12-L1:  A broad-based disc herniation is associated with the
osteophyte formation.  The mild facet hypertrophy is noted as well.
There is no focal stenosis.

L1-2:  Broad-based disc herniation is asymmetric to the left.  This
results in mild left foraminal narrowing.  There may be some
narrowing of the left lateral recess as well.  Facet hypertrophy
contributes.

L2-3:  Moderate facet hypertrophy is present.  There is near total
loss of disc height and slight retrolisthesis.  Mild foraminal
narrowing is worse on the left.

L3-4:  Moderate facet hypertrophy is present.  This results in
osseous foraminal narrowing, worse on the right.  There is mild
central canal narrowing as well.

L4-5:  Advanced facet hypertrophy is present.  Rightward endplate
spurring is noted.  Mild foraminal narrowing is worse on the right.

L5-S1:  A rightward disc herniation is present.  This extends into
the right foramen with mild foraminal narrowing.  There is
significant loss disc height.
IMPRESSION: 1.  Slight increase in size of a dominant sclerotic lesion on the
right at L2, concerning for metastatic disease.  Additional lesions
are present at L4, S2, and S3.
2.  Multilevel spondylosis of the lumbar spine as described above.

## 2012-11-20 ENCOUNTER — Encounter (HOSPITAL_COMMUNITY): Payer: Self-pay

## 2012-11-20 ENCOUNTER — Other Ambulatory Visit (HOSPITAL_COMMUNITY): Payer: Self-pay | Admitting: Internal Medicine

## 2012-11-20 ENCOUNTER — Ambulatory Visit (HOSPITAL_COMMUNITY)
Admission: RE | Admit: 2012-11-20 | Discharge: 2012-11-20 | Disposition: A | Discharge status: Home / Self Care | Payer: Medicare Other | Source: Ambulatory Visit | Attending: Internal Medicine | Admitting: Internal Medicine

## 2012-11-20 DIAGNOSIS — M810 Age-related osteoporosis without current pathological fracture: Secondary | ICD-10-CM | POA: Insufficient documentation

## 2012-11-20 MED ORDER — ZOLEDRONIC ACID 5 MG/100ML IV SOLN
5.0000 mg | Freq: Once | INTRAVENOUS | Status: AC
  Administered 2012-11-20: 5 mg via INTRAVENOUS
  Filled 2012-11-20: qty 100

## 2012-11-20 MED ORDER — SODIUM CHLORIDE 0.9 % IV SOLN
Freq: Once | INTRAVENOUS | Status: AC
  Administered 2012-11-20: 11:00:00 via INTRAVENOUS

## 2013-10-17 ENCOUNTER — Emergency Department (HOSPITAL_COMMUNITY): Payer: Medicare Other

## 2013-10-17 ENCOUNTER — Emergency Department (HOSPITAL_COMMUNITY)
Admission: EM | Admit: 2013-10-17 | Discharge: 2013-10-17 | Disposition: A | Discharge status: Home / Self Care | Payer: Medicare Other | Attending: Emergency Medicine | Admitting: Emergency Medicine

## 2013-10-17 ENCOUNTER — Encounter (HOSPITAL_COMMUNITY): Payer: Self-pay | Admitting: Emergency Medicine

## 2013-10-17 DIAGNOSIS — R1084 Generalized abdominal pain: Secondary | ICD-10-CM | POA: Insufficient documentation

## 2013-10-17 DIAGNOSIS — R110 Nausea: Secondary | ICD-10-CM | POA: Insufficient documentation

## 2013-10-17 DIAGNOSIS — R109 Unspecified abdominal pain: Secondary | ICD-10-CM

## 2013-10-17 DIAGNOSIS — G609 Hereditary and idiopathic neuropathy, unspecified: Secondary | ICD-10-CM | POA: Diagnosis not present

## 2013-10-17 DIAGNOSIS — N400 Enlarged prostate without lower urinary tract symptoms: Secondary | ICD-10-CM | POA: Diagnosis not present

## 2013-10-17 DIAGNOSIS — R5381 Other malaise: Secondary | ICD-10-CM | POA: Diagnosis not present

## 2013-10-17 DIAGNOSIS — Z79899 Other long term (current) drug therapy: Secondary | ICD-10-CM | POA: Diagnosis not present

## 2013-10-17 DIAGNOSIS — R638 Other symptoms and signs concerning food and fluid intake: Secondary | ICD-10-CM | POA: Insufficient documentation

## 2013-10-17 DIAGNOSIS — Z9889 Other specified postprocedural states: Secondary | ICD-10-CM | POA: Diagnosis not present

## 2013-10-17 DIAGNOSIS — R5383 Other fatigue: Secondary | ICD-10-CM

## 2013-10-17 DIAGNOSIS — K219 Gastro-esophageal reflux disease without esophagitis: Secondary | ICD-10-CM | POA: Diagnosis not present

## 2013-10-17 DIAGNOSIS — N23 Unspecified renal colic: Secondary | ICD-10-CM | POA: Insufficient documentation

## 2013-10-17 DIAGNOSIS — Z85038 Personal history of other malignant neoplasm of large intestine: Secondary | ICD-10-CM | POA: Diagnosis not present

## 2013-10-17 DIAGNOSIS — E119 Type 2 diabetes mellitus without complications: Secondary | ICD-10-CM | POA: Insufficient documentation

## 2013-10-17 DIAGNOSIS — E785 Hyperlipidemia, unspecified: Secondary | ICD-10-CM | POA: Insufficient documentation

## 2013-10-17 DIAGNOSIS — Z7982 Long term (current) use of aspirin: Secondary | ICD-10-CM | POA: Diagnosis not present

## 2013-10-17 DIAGNOSIS — K5900 Constipation, unspecified: Secondary | ICD-10-CM | POA: Diagnosis not present

## 2013-10-17 LAB — CBC WITH DIFFERENTIAL/PLATELET
Basophils Absolute: 0 10*3/uL (ref 0.0–0.1)
Basophils Relative: 0 % (ref 0–1)
Eosinophils Absolute: 0.1 10*3/uL (ref 0.0–0.7)
Eosinophils Relative: 1 % (ref 0–5)
HEMATOCRIT: 40.6 % (ref 39.0–52.0)
HEMOGLOBIN: 13.6 g/dL (ref 13.0–17.0)
LYMPHS PCT: 11 % — AB (ref 12–46)
Lymphs Abs: 1 10*3/uL (ref 0.7–4.0)
MCH: 32.1 pg (ref 26.0–34.0)
MCHC: 33.5 g/dL (ref 30.0–36.0)
MCV: 95.8 fL (ref 78.0–100.0)
MONO ABS: 0.6 10*3/uL (ref 0.1–1.0)
MONOS PCT: 6 % (ref 3–12)
Neutro Abs: 7.1 10*3/uL (ref 1.7–7.7)
Neutrophils Relative %: 82 % — ABNORMAL HIGH (ref 43–77)
Platelets: 205 10*3/uL (ref 150–400)
RBC: 4.24 MIL/uL (ref 4.22–5.81)
RDW: 12.5 % (ref 11.5–15.5)
WBC: 8.7 10*3/uL (ref 4.0–10.5)

## 2013-10-17 LAB — COMPREHENSIVE METABOLIC PANEL
ALT: 28 U/L (ref 0–53)
ANION GAP: 14 (ref 5–15)
AST: 29 U/L (ref 0–37)
Albumin: 3.9 g/dL (ref 3.5–5.2)
Alkaline Phosphatase: 81 U/L (ref 39–117)
BILIRUBIN TOTAL: 0.8 mg/dL (ref 0.3–1.2)
BUN: 20 mg/dL (ref 6–23)
CO2: 23 meq/L (ref 19–32)
CREATININE: 1.06 mg/dL (ref 0.50–1.35)
Calcium: 9.4 mg/dL (ref 8.4–10.5)
Chloride: 102 mEq/L (ref 96–112)
GFR calc Af Amer: 69 mL/min — ABNORMAL LOW (ref >90)
GFR calc non Af Amer: 60 mL/min — ABNORMAL LOW (ref >90)
Glucose, Bld: 198 mg/dL — ABNORMAL HIGH (ref 70–99)
Potassium: 4.8 mEq/L (ref 3.7–5.3)
Sodium: 139 mEq/L (ref 137–147)
Total Protein: 7.1 g/dL (ref 6.0–8.3)

## 2013-10-17 LAB — URINALYSIS, ROUTINE W REFLEX MICROSCOPIC
Bilirubin Urine: NEGATIVE
Glucose, UA: NEGATIVE mg/dL
Ketones, ur: NEGATIVE mg/dL
Leukocytes, UA: NEGATIVE
Nitrite: NEGATIVE
PROTEIN: NEGATIVE mg/dL
SPECIFIC GRAVITY, URINE: 1.018 (ref 1.005–1.030)
UROBILINOGEN UA: 0.2 mg/dL (ref 0.0–1.0)
pH: 5 (ref 5.0–8.0)

## 2013-10-17 LAB — POC OCCULT BLOOD, ED: Fecal Occult Bld: NEGATIVE

## 2013-10-17 LAB — URINE MICROSCOPIC-ADD ON

## 2013-10-17 LAB — LIPASE, BLOOD: LIPASE: 12 U/L (ref 11–59)

## 2013-10-17 LAB — I-STAT CG4 LACTIC ACID, ED: Lactic Acid, Venous: 1.44 mmol/L (ref 0.5–2.2)

## 2013-10-17 MED ORDER — MORPHINE SULFATE 2 MG/ML IJ SOLN
2.0000 mg | Freq: Once | INTRAMUSCULAR | Status: AC
  Administered 2013-10-17: 2 mg via INTRAVENOUS
  Filled 2013-10-17: qty 1

## 2013-10-17 MED ORDER — SODIUM CHLORIDE 0.9 % IV BOLUS (SEPSIS)
1000.0000 mL | Freq: Once | INTRAVENOUS | Status: AC
  Administered 2013-10-17: 1000 mL via INTRAVENOUS

## 2013-10-17 MED ORDER — IOHEXOL 300 MG/ML  SOLN
100.0000 mL | Freq: Once | INTRAMUSCULAR | Status: AC | PRN
  Administered 2013-10-17: 100 mL via INTRAVENOUS

## 2013-10-17 MED ORDER — ONDANSETRON HCL 8 MG PO TABS: 8.0000 mg | ORAL_TABLET | Freq: Three times a day (TID) | ORAL | Status: AC | PRN

## 2013-10-17 MED ORDER — IOHEXOL 300 MG/ML  SOLN
50.0000 mL | Freq: Once | INTRAMUSCULAR | Status: AC | PRN
  Administered 2013-10-17: 50 mL via ORAL

## 2013-10-17 MED ORDER — ONDANSETRON HCL 4 MG/2ML IJ SOLN
4.0000 mg | Freq: Once | INTRAMUSCULAR | Status: AC
  Administered 2013-10-17: 4 mg via INTRAVENOUS
  Filled 2013-10-17: qty 2

## 2013-10-17 MED ORDER — HYDROCODONE-ACETAMINOPHEN 5-325 MG PO TABS: 1.0000 | ORAL_TABLET | Freq: Three times a day (TID) | ORAL | Status: AC | PRN

## 2013-10-17 NOTE — ED Provider Notes (Signed)
3:45 PM seen by me complains of low crampy abdominal pain onset this morning. Last bowel movement 2 days ago. No nausea or vomiting. States he hasn't passed gas today. He stated pain initially felt like might be bowel obstruction however feels much improved since treatment in the emergency department. On exam no distress abdomen nondistended midline surgical scar, normal bowel sounds nontender genitalia normal male 6:05 PM patient asymptomatic, resting comfortably after treatment with intravenous morphine He feels ready to go home Plan prescription [facility] referral. Results for orders placed during the hospital encounter of 10/17/13  CBC WITH DIFFERENTIAL      Result Value Ref Range   WBC 8.7  4.0 - 10.5 K/uL   RBC 4.24  4.22 - 5.81 MIL/uL   Hemoglobin 13.6  13.0 - 17.0 g/dL   HCT 40.6  39.0 - 52.0 %   MCV 95.8  78.0 - 100.0 fL   MCH 32.1  26.0 - 34.0 pg   MCHC 33.5  30.0 - 36.0 g/dL   RDW 12.5  11.5 - 15.5 %   Platelets 205  150 - 400 K/uL   Neutrophils Relative % 82 (*) 43 - 77 %   Neutro Abs 7.1  1.7 - 7.7 K/uL   Lymphocytes Relative 11 (*) 12 - 46 %   Lymphs Abs 1.0  0.7 - 4.0 K/uL   Monocytes Relative 6  3 - 12 %   Monocytes Absolute 0.6  0.1 - 1.0 K/uL   Eosinophils Relative 1  0 - 5 %   Eosinophils Absolute 0.1  0.0 - 0.7 K/uL   Basophils Relative 0  0 - 1 %   Basophils Absolute 0.0  0.0 - 0.1 K/uL  COMPREHENSIVE METABOLIC PANEL      Result Value Ref Range   Sodium 139  137 - 147 mEq/L   Potassium 4.8  3.7 - 5.3 mEq/L   Chloride 102  96 - 112 mEq/L   CO2 23  19 - 32 mEq/L   Glucose, Bld 198 (*) 70 - 99 mg/dL   BUN 20  6 - 23 mg/dL   Creatinine, Ser 1.06  0.50 - 1.35 mg/dL   Calcium 9.4  8.4 - 10.5 mg/dL   Total Protein 7.1  6.0 - 8.3 g/dL   Albumin 3.9  3.5 - 5.2 g/dL   AST 29  0 - 37 U/L   ALT 28  0 - 53 U/L   Alkaline Phosphatase 81  39 - 117 U/L   Total Bilirubin 0.8  0.3 - 1.2 mg/dL   GFR calc non Af Amer 60 (*) >90 mL/min   GFR calc Af Amer 69 (*)  >90 mL/min   Anion gap 14  5 - 15  LIPASE, BLOOD      Result Value Ref Range   Lipase 12  11 - 59 U/L  URINALYSIS, ROUTINE W REFLEX MICROSCOPIC      Result Value Ref Range   Color, Urine YELLOW  YELLOW   APPearance CLOUDY (*) CLEAR   Specific Gravity, Urine 1.018  1.005 - 1.030   pH 5.0  5.0 - 8.0   Glucose, UA NEGATIVE  NEGATIVE mg/dL   Hgb urine dipstick LARGE (*) NEGATIVE   Bilirubin Urine NEGATIVE  NEGATIVE   Ketones, ur NEGATIVE  NEGATIVE mg/dL   Protein, ur NEGATIVE  NEGATIVE mg/dL   Urobilinogen, UA 0.2  0.0 - 1.0 mg/dL   Nitrite NEGATIVE  NEGATIVE   Leukocytes, UA NEGATIVE  NEGATIVE  URINE MICROSCOPIC-ADD  ON      Result Value Ref Range   Squamous Epithelial / LPF RARE  RARE   RBC / HPF TOO NUMEROUS TO COUNT  <3 RBC/hpf  I-STAT CG4 LACTIC ACID, ED      Result Value Ref Range   Lactic Acid, Venous 1.44  0.5 - 2.2 mmol/L  POC OCCULT BLOOD, ED      Result Value Ref Range   Fecal Occult Bld NEGATIVE  NEGATIVE   Ct Abdomen Pelvis W Contrast  10/17/2013   CLINICAL DATA:  Generalized abdominal pain beginning this morning. History of colon cancer diagnosed 4 years ago.  EXAM: CT ABDOMEN AND PELVIS WITH CONTRAST  TECHNIQUE: Multidetector CT imaging of the abdomen and pelvis was performed using the standard protocol following bolus administration of intravenous contrast.  CONTRAST:  50 mL OMNIPAQUE IOHEXOL 300 MG/ML SOLN, 100 mL OMNIPAQUE IOHEXOL 300 MG/ML SOLN  COMPARISON:  CT abdomen and pelvis 03/05/2011 and 09/28/2009.  FINDINGS: The patient has a small hiatal hernia. Clips are seen about the gastroesophageal junction. There is no pleural or pericardial effusion. Chronic micro nodularity in the posterior left lower lobe is compatible with remote infectious or inflammatory process.  The patient has mild left hydronephrosis due to a 0.3 cm stone just proximal to the left UVJ. There is some stranding about the left kidney and ureter. A punctate nonobstructing stone is identified in the  lower pole of the right kidney. A second stone in the upper pole the right kidney measures 0.6 cm in diameter. There is no right hydronephrosis or right ureteral stone.  Scattered low attenuating lesions in the liver are unchanged and consistent with cysts. The spleen, adrenal glands and biliary tree are unremarkable. There is some fatty atrophy of the pancreas which is unchanged. The patient is status post left hemicolectomy without evidence of complication. The colon is otherwise unremarkable. Small bowel appears normal. No lymphadenopathy is seen.  No lytic or sclerotic bony lesion is identified. There is partial visualization of fracture fixation hardware in the right femur. Marked multilevel spondylosis is seen.  IMPRESSION: Mild left hydronephrosis due to a 0.3 cm stone just proximal to the UVJ.  Two nonobstructing stones right kidney.  The larger measures 0.6 cm.  Status post left hemicolectomy without evidence of complication.  Small hiatal hernia. Postoperative change gastroesophageal junction noted.   Electronically Signed   By: Inge Rise M.D.   On: 10/17/2013 16:17   Dg Abd Acute W/chest  10/17/2013   CLINICAL DATA:  Abdominal pain. History of colon resection. Type 2 diabetes.  EXAM: ACUTE ABDOMEN SERIES (ABDOMEN 2 VIEW & CHEST 1 VIEW)  COMPARISON:  10/04/2009 abdomen films. Chest radiograph most recent of 10/03/2009.  FINDINGS: Frontal view of the chest demonstrates midline trachea. Mild cardiomegaly with atherosclerosis in the transverse aorta. No pleural effusion or pneumothorax. Mildly low lung volumes. Clear lungs.  Abdominal films demonstrate no free intraperitoneal air or significant air-fluid levels on right-sided decubitus imaging. Supine views demonstrate no gaseous distention of bowel loops. Distal gas is seen. There is a mild paucity of small bowel gas, nonspecific. Stool and gas are seen in the right side of the colon. Surgical sutures in the right upper quadrant. No abnormal  abdominal calcifications. No appendicolith. Right proximal femoral fixation.  IMPRESSION: No acute findings.   Electronically Signed   By: Abigail Miyamoto M.D.   On: 10/17/2013 14:38   Dx #1 ureteral colic #[id number]  Orlie Dakin, MD 10/17/13 [id number]

## 2013-10-17 NOTE — ED Notes (Signed)
He c/o generalized abd. Pain which started sometime this morning.  He states he has had this type of pain a few times before, and each time it proved to be a "bowel obstruction".  He states that some years ago he underwent major colon resection for colon cancer with obstruction.  He states he did eat breakfast this morning.

## 2013-10-17 NOTE — ED Provider Notes (Signed)
CSN: [id number]     Arrival date & time 10/17/13  1303 History   First MD Initiated Contact with Patient 10/17/13 1324     Chief Complaint  Patient presents with  . Abdominal Pain     (Consider location/radiation/quality/duration/timing/severity/associated sxs/prior Treatment) HPI Comments: Patient presents with generalized abdominal pain, worse in the lower quadrants onset this morning he woke up. Pain is constant. States had this before when he had a bowel obstruction. Had bowel Resection surgery 4 years ago. Denies any vomiting. States he has not had a bowel movement 2 days which is abnormal. Denies any blood in stool. Denies any fever or chills. Denies any urinary symptoms. Denies any testicular pain. Denies any chest pain or shortness of breath. States had physical by PCP  Dr. Joylene Draft 3 days ago and was given a "clean bill of health".  The history is provided by the patient.    Past Medical History  Diagnosis Date  . Colon cancer 05/07/2011  . Type 2 diabetes mellitus 05/07/2011  . Peripheral neuropathy 05/07/2011  . GERD (gastroesophageal reflux disease) 05/07/2011  . Osteoporosis 05/07/2011  . BPH (benign prostatic hypertrophy) 05/07/2011  . Hyperlipidemia 05/07/2011   No past surgical history on file. No family history on file. History  Substance Use Topics  . Smoking status: Not on file  . Smokeless tobacco: Not on file  . Alcohol Use: Not on file    Review of Systems  Constitutional: Positive for activity change and appetite change. Negative for fever.  Respiratory: Negative for cough, chest tightness and shortness of breath.   Cardiovascular: Negative for chest pain.  Gastrointestinal: Positive for nausea, abdominal pain and constipation. Negative for vomiting.  Genitourinary: Negative for dysuria, urgency, hematuria and testicular pain.  Musculoskeletal: Negative for arthralgias, back pain and myalgias.  Skin: Negative for rash.  Neurological: Positive for weakness. Negative  for dizziness, light-headedness and headaches.  A complete 10 system review of systems was obtained and all systems are negative except as noted in the HPI and PMH.      Allergies  Review of patient's allergies indicates no known allergies.  Home Medications   Prior to Admission medications   Medication Sig Start Date End Date Taking? Authorizing Provider  aspirin 81 MG tablet Take 81 mg by mouth daily.   Yes Historical Provider, MD  buPROPion (WELLBUTRIN) 100 MG tablet Take 100 mg by mouth daily.   Yes Historical Provider, MD  calcium-vitamin D (OSCAL WITH D) 500-200 MG-UNIT per tablet Take 1 tablet by mouth daily.   Yes Historical Provider, MD  cholestyramine Lucrezia Starch) 4 G packet Take 1 packet by mouth as needed (for diarrhea).    Yes Historical Provider, MD  cyanocobalamin (,VITAMIN B-12,) 1000 MCG/ML injection Inject 1,000 mcg into the muscle every 14 (fourteen) days.   Yes Historical Provider, MD  famotidine (PEPCID) 20 MG tablet Take 20 mg by mouth daily.    Yes Historical Provider, MD  finasteride (PROSCAR) 5 MG tablet Take 5 mg by mouth daily.   Yes Historical Provider, MD  latanoprost (XALATAN) 0.005 % ophthalmic solution Place 1 drop into both eyes at bedtime.   Yes Historical Provider, MD  metFORMIN (GLUCOPHAGE) 500 MG tablet Take 500 mg by mouth daily with breakfast.    Yes Historical Provider, MD  metoCLOPramide (REGLAN) 10 MG tablet Take 10 mg by mouth 4 (four) times daily as needed for nausea or vomiting.    Yes Historical Provider, MD  Multiple Vitamin (MULTIVITAMIN) tablet Take 1 tablet  by mouth daily.   Yes Historical Provider, MD  pravastatin (PRAVACHOL) 20 MG tablet Take 20 mg by mouth daily.   Yes Historical Provider, MD  timolol (BETIMOL) 0.5 % ophthalmic solution Place 1 drop into the right eye daily.   Yes Historical Provider, MD  zoledronic acid (RECLAST) 5 MG/100ML SOLN injection Inject 5 mg into the vein once.   Yes Historical Provider, MD   BP 128/73  Pulse  70  Temp(Src) 98.3 F (36.8 C) (Oral)  Resp 16  SpO2 97% Physical Exam  Nursing note and vitals reviewed. Constitutional: He is oriented to person, place, and time. He appears well-developed and well-nourished. No distress.  HENT:  Head: Normocephalic and atraumatic.  Mouth/Throat: Oropharynx is clear and moist. No oropharyngeal exudate.  Eyes: Conjunctivae and EOM are normal. Pupils are equal, round, and reactive to light.  aniscoria with irregular R pupil  Neck: Normal range of motion. Neck supple.  No meningismus.  Cardiovascular: Normal rate, regular rhythm, normal heart sounds and intact distal pulses.   No murmur heard. Pulmonary/Chest: Effort normal and breath sounds normal. No respiratory distress.  Abdominal: Soft. There is tenderness. There is no guarding.  Diffuse tenderness, worse periumbilically  Genitourinary:  No testicular tenderness No fecal impaction  Musculoskeletal: Normal range of motion. He exhibits no edema and no tenderness.  Neurological: He is alert and oriented to person, place, and time. No cranial nerve deficit. He exhibits normal muscle tone. Coordination normal.  No ataxia on finger to nose bilaterally. No pronator drift. 5/5 strength throughout. CN 2-12 intact. Negative Romberg. Equal grip strength. Sensation intact. Gait is normal.   Skin: Skin is warm.  Psychiatric: He has a normal mood and affect. His behavior is normal.    ED Course  Procedures (including critical care time) Labs Review Labs Reviewed  CBC WITH DIFFERENTIAL - Abnormal; Notable for the following:    Neutrophils Relative % 82 (*)    Lymphocytes Relative 11 (*)    All other components within normal limits  COMPREHENSIVE METABOLIC PANEL - Abnormal; Notable for the following:    Glucose, Bld 198 (*)    GFR calc non Af Amer 60 (*)    GFR calc Af Amer 69 (*)    All other components within normal limits  LIPASE, BLOOD  URINALYSIS, ROUTINE W REFLEX MICROSCOPIC  I-STAT CG4 LACTIC  ACID, ED  POC OCCULT BLOOD, ED    Imaging Review Dg Abd Acute W/chest  10/17/2013   CLINICAL DATA:  Abdominal pain. History of colon resection. Type 2 diabetes.  EXAM: ACUTE ABDOMEN SERIES (ABDOMEN 2 VIEW & CHEST 1 VIEW)  COMPARISON:  10/04/2009 abdomen films. Chest radiograph most recent of 10/03/2009.  FINDINGS: Frontal view of the chest demonstrates midline trachea. Mild cardiomegaly with atherosclerosis in the transverse aorta. No pleural effusion or pneumothorax. Mildly low lung volumes. Clear lungs.  Abdominal films demonstrate no free intraperitoneal air or significant air-fluid levels on right-sided decubitus imaging. Supine views demonstrate no gaseous distention of bowel loops. Distal gas is seen. There is a mild paucity of small bowel gas, nonspecific. Stool and gas are seen in the right side of the colon. Surgical sutures in the right upper quadrant. No abnormal abdominal calcifications. No appendicolith. Right proximal femoral fixation.  IMPRESSION: No acute findings.   Electronically Signed   By: Abigail Miyamoto M.D.   On: 10/17/2013 14:38     EKG Interpretation None      MDM   Final diagnoses:  None  History of multiple abdominal surgeries presenting with lower abdominal pain, nausea and constipation. Abdomen soft without peritoneal signs.  hemoccult blood negative. Hemoglobin stable. X-ray shows no evidence of obstruction. We'll and CT imaging given extensive surgical history.  CT pending at time of sign out to Dr. Winfred Leeds.     Ezequiel Essex, MD 10/17/13 [phone]

## 2013-10-17 NOTE — Discharge Instructions (Signed)
Kidney Stones Take Tylenol for mild pain or the medicine prescribed for bad pain. Return if your pain is not well controlled with the medications prescribed. Your blood sugar today was mildly elevated at 198 Kidney stones (urolithiasis) are deposits that form inside your kidneys. The intense pain is caused by the stone moving through the urinary tract. When the stone moves, the ureter goes into spasm around the stone. The stone is usually passed in the urine.  CAUSES   A disorder that makes certain neck glands produce too much parathyroid hormone (primary hyperparathyroidism).  A buildup of uric acid crystals, similar to gout in your joints.  Narrowing (stricture) of the ureter.  A kidney obstruction present at birth (congenital obstruction).  Previous surgery on the kidney or ureters.  Numerous kidney infections. SYMPTOMS   Feeling sick to your stomach (nauseous).  Throwing up (vomiting).  Blood in the urine (hematuria).  Pain that usually spreads (radiates) to the groin.  Frequency or urgency of urination. DIAGNOSIS   Taking a history and physical exam.  Blood or urine tests.  CT scan.  Occasionally, an examination of the inside of the urinary bladder (cystoscopy) is performed. TREATMENT   Observation.  Increasing your fluid intake.  Extracorporeal shock wave lithotripsy--This is a noninvasive procedure that uses shock waves to break up kidney stones.  Surgery may be needed if you have severe pain or persistent obstruction. There are various surgical procedures. Most of the procedures are performed with the use of small instruments. Only small incisions are needed to accommodate these instruments, so recovery time is minimized. The size, location, and chemical composition are all important variables that will determine the proper choice of action for you. Talk to your health care provider to better understand your situation so that you will minimize the risk of injury  to yourself and your kidney.  HOME CARE INSTRUCTIONS   Drink enough water and fluids to keep your urine clear or pale yellow. This will help you to pass the stone or stone fragments.  Strain all urine through the provided strainer. Keep all particulate matter and stones for your health care provider to see. The stone causing the pain may be as small as a grain of salt. It is very important to use the strainer each and every time you pass your urine. The collection of your stone will allow your health care provider to analyze it and verify that a stone has actually passed. The stone analysis will often identify what you can do to reduce the incidence of recurrences.  Only take over-the-counter or prescription medicines for pain, discomfort, or fever as directed by your health care provider.  Make a follow-up appointment with your health care provider as directed.  Get follow-up X-rays if required. The absence of pain does not always mean that the stone has passed. It may have only stopped moving. If the urine remains completely obstructed, it can cause loss of kidney function or even complete destruction of the kidney. It is your responsibility to make sure X-rays and follow-ups are completed. Ultrasounds of the kidney can show blockages and the status of the kidney. Ultrasounds are not associated with any radiation and can be performed easily in a matter of minutes. SEEK MEDICAL CARE IF:  You experience pain that is progressive and unresponsive to any pain medicine you have been prescribed. SEEK IMMEDIATE MEDICAL CARE IF:   Pain cannot be controlled with the prescribed medicine.  You have a fever or shaking chills.  The severity or intensity of pain increases over 18 hours and is not relieved by pain medicine.  You develop a new onset of abdominal pain.  You feel faint or pass out.  You are unable to urinate. MAKE SURE YOU:   Understand these instructions.  Will watch your  condition.  Will get help right away if you are not doing well or get worse. Document Released: 02/19/2005 Document Revised: 10/22/2012 Document Reviewed: 07/23/2012 [facility] Patient Information 2015 [place], [place]. This information is not intended to replace advice given to you by your health care provider. Make sure you discuss any questions you have with your health care provider.

## 2013-12-15 ENCOUNTER — Encounter (HOSPITAL_COMMUNITY): Payer: Self-pay

## 2013-12-15 ENCOUNTER — Ambulatory Visit (HOSPITAL_COMMUNITY)
Admission: RE | Admit: 2013-12-15 | Discharge: 2013-12-15 | Disposition: A | Discharge status: Home / Self Care | Payer: Medicare Other | Source: Ambulatory Visit | Attending: Internal Medicine | Admitting: Internal Medicine

## 2013-12-15 DIAGNOSIS — Z7983 Long term (current) use of bisphosphonates: Secondary | ICD-10-CM | POA: Diagnosis not present

## 2013-12-15 MED ORDER — ZOLEDRONIC ACID 5 MG/100ML IV SOLN
5.0000 mg | Freq: Once | INTRAVENOUS | Status: AC
  Administered 2013-12-15: 5 mg via INTRAVENOUS
  Filled 2013-12-15: qty 100

## 2013-12-15 MED ORDER — SODIUM CHLORIDE 0.9 % IV SOLN
INTRAVENOUS | Status: DC
  Administered 2013-12-15: 14:00:00 via INTRAVENOUS

## 2014-12-16 ENCOUNTER — Other Ambulatory Visit (HOSPITAL_COMMUNITY): Payer: Self-pay | Admitting: Internal Medicine

## 2014-12-20 ENCOUNTER — Other Ambulatory Visit (HOSPITAL_COMMUNITY): Payer: Self-pay | Admitting: Internal Medicine

## 2014-12-20 ENCOUNTER — Ambulatory Visit (HOSPITAL_COMMUNITY)
Admission: RE | Admit: 2014-12-20 | Discharge: 2014-12-20 | Disposition: A | Discharge status: Home / Self Care | Payer: Medicare Other | Source: Ambulatory Visit | Attending: Internal Medicine | Admitting: Internal Medicine

## 2014-12-20 ENCOUNTER — Encounter (HOSPITAL_COMMUNITY): Payer: Self-pay

## 2014-12-20 DIAGNOSIS — M810 Age-related osteoporosis without current pathological fracture: Secondary | ICD-10-CM | POA: Diagnosis present

## 2014-12-20 MED ORDER — SODIUM CHLORIDE 0.9 % IV SOLN
INTRAVENOUS | Status: DC
  Administered 2014-12-20: 250 mL via INTRAVENOUS

## 2014-12-20 MED ORDER — ZOLEDRONIC ACID 5 MG/100ML IV SOLN
5.0000 mg | Freq: Once | INTRAVENOUS | Status: AC
  Administered 2014-12-20: 5 mg via INTRAVENOUS
  Filled 2014-12-20: qty 100

## 2014-12-20 NOTE — Progress Notes (Signed)
D/c instructions on reclast given and explained to pt and his wife.  Pt currently takes vitamin d and calcium daily.  reclast tolerated without complications.  Called pts ride before d/c.  They will wait in lobby for the facility to pick them up.  Pt d/c ambulatory using walker to lobby. Offered wheelchair, but pt declined.

## 2014-12-20 NOTE — Discharge Instructions (Signed)

## 2015-06-03 ENCOUNTER — Encounter: Payer: Self-pay | Admitting: Diagnostic Neuroimaging

## 2015-06-03 ENCOUNTER — Ambulatory Visit (INDEPENDENT_AMBULATORY_CARE_PROVIDER_SITE_OTHER): Payer: Medicare Other | Admitting: Diagnostic Neuroimaging

## 2015-06-03 VITALS — BP 132/78 | HR 88 | Ht 66.0 in | Wt 136.8 lb

## 2015-06-03 DIAGNOSIS — M720 Palmar fascial fibromatosis [Dupuytren]: Secondary | ICD-10-CM | POA: Diagnosis not present

## 2015-06-03 DIAGNOSIS — R200 Anesthesia of skin: Secondary | ICD-10-CM

## 2015-06-03 DIAGNOSIS — G63 Polyneuropathy in diseases classified elsewhere: Secondary | ICD-10-CM

## 2015-06-03 DIAGNOSIS — E538 Deficiency of other specified B group vitamins: Secondary | ICD-10-CM

## 2015-06-03 DIAGNOSIS — G5603 Carpal tunnel syndrome, bilateral upper limbs: Secondary | ICD-10-CM | POA: Diagnosis not present

## 2015-06-03 DIAGNOSIS — E1142 Type 2 diabetes mellitus with diabetic polyneuropathy: Secondary | ICD-10-CM

## 2015-06-03 DIAGNOSIS — R208 Other disturbances of skin sensation: Secondary | ICD-10-CM

## 2015-06-03 DIAGNOSIS — G609 Hereditary and idiopathic neuropathy, unspecified: Secondary | ICD-10-CM | POA: Diagnosis not present

## 2015-06-03 NOTE — Patient Instructions (Addendum)
Thank you for coming to see [place] at [facility]. I hope we have been able to provide you high quality care today.  You may receive a patient satisfaction survey over the next few weeks. We would appreciate your feedback and comments so that we may continue to improve ourselves and the health of our patients.  - monitor symptoms - may consider referral to hand specialist / surgeon for steroid injections to treat: carpal tunnel syndrome and Dupuytren's contracture of both hands   ~~~~~~~~~~~~~~~~~~~~~~~~~~~~~~~~~~~~~~~~~~~~~~~~~~~~~~~~~~~~~~~~~  DR. Allyce Bochicchio'S GUIDE TO HAPPY AND HEALTHY LIVING These are some of my general health and wellness recommendations. Some of them may apply to you better than others. Please use common sense as you try these suggestions and feel free to ask me any questions.   ACTIVITY/FITNESS Mental, social, emotional and physical stimulation are very important for brain and body health. Try learning a new activity (arts, music, language, sports, games).  Keep moving your body to the best of your abilities. You can do this at home, inside or outside, the park, community center, gym or anywhere you like. Consider a physical therapist or personal trainer to get started. Consider the app Sworkit. Fitness trackers such as smart-watches, smart-phones or Fitbits can help as well.   NUTRITION Eat more plants: colorful vegetables, nuts, seeds and berries.  Eat less sugar, salt, preservatives and processed foods.  Avoid toxins such as cigarettes and alcohol.  Drink water when you are thirsty. Warm water with a slice of lemon is an excellent morning drink to start the day.  Consider these websites for more information The Nutrition Source (https://www.henry-hernandez.biz/) [employer] Nutrition (WindowBlog.ch)   RELAXATION Consider practicing mindfulness meditation or other relaxation techniques such as deep  breathing, prayer, yoga, tai chi, massage. See website mindful.org or the apps Headspace or Calm to help get started.   SLEEP Try to get at least 7-8+ hours sleep per day. Regular exercise and reduced caffeine will help you sleep better. Practice good sleep hygeine techniques. See website sleep.org for more information.   PLANNING Prepare estate planning, living will, healthcare POA documents. Sometimes this is best planned with the help of an attorney. Theconversationproject.org and agingwithdignity.org are excellent resources.

## 2015-06-03 NOTE — Progress Notes (Signed)
[facility]  PATIENT: Tony Moreno DOB: 19-May-1920  REFERRING CLINICIAN: Perini HISTORY FROM: patient and wife REASON FOR VISIT: new consult    HISTORICAL  CHIEF COMPLAINT:  Chief Complaint  Patient presents with  . Numbness    rm 7, New Pt, wife- Romie Minus, "bilateral hand numbness x 1 month"    HISTORY OF PRESENT ILLNESS:   82 year old right-handed male here for evaluation of bilateral hand numbness. Symptoms of an present for past 2-3 months. Patient has history of type 2 diabetes, vitamin B12 deficiency, peripheral neuropathy, dupuytrens contractures, carpal tunnel syndrome.  Patient describes feeling of sandpaper in his fingertips in digits 1-3. Symptoms came on fairly suddenly and have been persistent. No significant pain. Patient having trouble with fine movements and discussed 30. He is having a special trouble manipulating his hearing aids. Left hand is slightly more affected than the right hand.  Patient has history of carpal tunnel surgery on his right hand several years ago. He also has dupuytrens contractures in both hands and has been treated with injections in the past.  Patient denies any significant neck pain or radicular pain.    REVIEW OF SYSTEMS: Full 14 system review of systems performed and negative with exception of: Hearing loss ringing in ears double vision memory loss numbness runny nose incontinence impotence.  ALLERGIES: No Known Allergies  HOME MEDICATIONS: Outpatient Prescriptions Prior to Visit  Medication Sig Dispense Refill  . aspirin 81 MG tablet Take 81 mg by mouth daily.    Marland Kitchen buPROPion (WELLBUTRIN) 100 MG tablet Take 100 mg by mouth daily.    . calcium-vitamin D (OSCAL WITH D) 500-200 MG-UNIT per tablet Take 1 tablet by mouth daily.    . cholestyramine (QUESTRAN) 4 G packet Take 1 packet by mouth as needed (for diarrhea).     . cyanocobalamin (,VITAMIN B-12,) 1000 MCG/ML injection Inject 1,000 mcg into the muscle  every 14 (fourteen) days.    . famotidine (PEPCID) 20 MG tablet Take 20 mg by mouth daily.     . finasteride (PROSCAR) 5 MG tablet Take 5 mg by mouth daily.    Marland Kitchen HYDROcodone-acetaminophen (NORCO) 5-325 MG per tablet Take 1 tablet by mouth 3 (three) times daily as needed for severe pain. 20 tablet 0  . latanoprost (XALATAN) 0.005 % ophthalmic solution Place 1 drop into both eyes at bedtime.    . metFORMIN (GLUCOPHAGE) 500 MG tablet Take 500 mg by mouth daily with breakfast.     . metoCLOPramide (REGLAN) 10 MG tablet Take 10 mg by mouth 4 (four) times daily as needed for nausea or vomiting.     . Multiple Vitamin (MULTIVITAMIN) tablet Take 1 tablet by mouth daily.    . ondansetron (ZOFRAN) 8 MG tablet Take 1 tablet (8 mg total) by mouth every 8 (eight) hours as needed for nausea or vomiting. 4 tablet 0  . pravastatin (PRAVACHOL) 20 MG tablet Take 20 mg by mouth daily.    . timolol (BETIMOL) 0.5 % ophthalmic solution Place 1 drop into the right eye daily.    . zoledronic acid (RECLAST) 5 MG/100ML SOLN injection Inject 5 mg into the vein once.     No facility-administered medications prior to visit.    PAST MEDICAL HISTORY: Past Medical History  Diagnosis Date  . Type 2 diabetes mellitus ([place]) 05/07/2011  . Peripheral neuropathy ([place]) 05/07/2011  . GERD (gastroesophageal reflux disease) 05/07/2011  . Osteoporosis 05/07/2011  . BPH (benign prostatic hypertrophy) 05/07/2011  . Hyperlipidemia 05/07/2011  .  Colon cancer ([place]) 05/07/2011    PAST SURGICAL HISTORY: Past Surgical History  Procedure Laterality Date  . Cholecystectomy  1970s  . Appendectomy  1970s  . Eye surgery    . Colon surgery  1970s. 2013 w colon resection to to surgery  . Fracture surgery      x2 femur w plates, fx left wrist- surgery  w screws    FAMILY HISTORY: Family History  Problem Relation Age of Onset  . Heart attack Father   . Cancer Sister     SOCIAL HISTORY:  Social History   Social History  . Marital Status:  Married    Spouse Name: Romie Minus  . Number of Children: 5  . Years of Education: MD   Occupational History  .      retired radiologist [employer]   Social History Main Topics  . Smoking status: Former Smoker -- 3.00 packs/day    Quit date: 12/15/1969  . Smokeless tobacco: Not on file  . Alcohol Use: Yes     Comment: glass of wine nightly  . Drug Use: No  . Sexual Activity: Not on file   Other Topics Concern  . Not on file   Social History Narrative   Lives at home with wife   Caffeine use- coffee, 1 cup daily     PHYSICAL EXAM  GENERAL EXAM/CONSTITUTIONAL: Vitals:  Filed Vitals:   06/03/15 1016  BP: 132/78  Pulse: 88  Height: 5\' 6"  (1.676 m)  Weight: 136 lb 12.8 oz (62.052 kg)     Body mass index is 22.09 kg/(m^2).  Vision Screening Comments: 06/03/15 glasses for diplopia  Patient is in no distress; well developed, nourished and groomed; neck is supple  CARDIOVASCULAR:  Examination of carotid arteries is normal; no carotid bruits  Regular rate and rhythm, no murmurs  Examination of peripheral vascular system by observation and palpation is normal  EYES:  Ophthalmoscopic exam of optic discs and posterior segments is normal; no papilledema or hemorrhages  MUSCULOSKELETAL:  Gait, strength, tone, movements noted in Neurologic exam below  NEUROLOGIC: MENTAL STATUS:  No flowsheet data found.  awake, alert, oriented to person, place and time  recent and remote memory intact  normal attention and concentration  language fluent, comprehension intact, naming intact,   fund of knowledge appropriate  CRANIAL NERVE:   2nd - no papilledema on fundoscopic exam  2nd, 3rd, 4th, 6th - PUPILS POST-SURG; RIGHT 4MM NO REACTION; LEFT PINPOINT NO REACTION; visual fields full to confrontation, extraocular muscles --> LEFT EYE DECR ABDUCTION WITH DOUBLE VISION;   5th - facial sensation symmetric  7th - facial strength symmetric  8th - hearing -->  SEVERELY DECREASED  9th - palate elevates symmetrically, uvula midline  11th - shoulder shrug symmetric  12th - tongue protrusion midline  MOTOR:   normal bulk and tone, DIFFUSE 4/5 STRENGTH IN BUE; BLE 3-4 PROX AND 1-2 DISTAL; SEVERE WEAKNESS OF BILATERAL FOOT DF  TENDON CONTRACTURES OF BILATERAL HANDS, ESP DIGITS 4 AND 5  WEAKNESS AND ATROPHY OF BILATERAL APB AND INTRINSIC HAND MUSCLES  RIGHT HAND PALMAR SCAR FROM PRIOR CTS SURGERY  SENSORY:   normal and symmetric to light touch; DECR PP IN BILATERAL FINGERS (DIGITS 1-2_  ABSENT VIB AT TOES, ANKLES; DECR VIB AT KNEES  COORDINATION:   finger-nose-finger, fine finger movements SLOW  REFLEXES:   deep tendon reflexes --> BUE TRACE; BLE ABSENT  GAIT/STATION:   USES WALKER; SLOW CAUTIOUS, UNSTEADY    DIAGNOSTIC DATA (LABS,  IMAGING, TESTING) - I reviewed patient records, labs, notes, testing and imaging myself where available.  Lab Results  Component Value Date   WBC 8.7 10/17/2013   HGB 13.6 10/17/2013   HCT 40.6 10/17/2013   MCV 95.8 10/17/2013   PLT 205 10/17/2013      Component Value Date/Time   NA 139 10/17/2013 1328   K 4.8 10/17/2013 1328   CL 102 10/17/2013 1328   CO2 23 10/17/2013 1328   GLUCOSE 198* 10/17/2013 1328   BUN 20 10/17/2013 1328   CREATININE 1.06 10/17/2013 1328   CALCIUM 9.4 10/17/2013 1328   PROT 7.1 10/17/2013 1328   ALBUMIN 3.9 10/17/2013 1328   AST 29 10/17/2013 1328   ALT 28 10/17/2013 1328   ALKPHOS 81 10/17/2013 1328   BILITOT 0.8 10/17/2013 1328   GFRNONAA 60* 10/17/2013 1328   GFRAA 69* 10/17/2013 1328   Lab Results  Component Value Date   CHOL  10/10/2009    148        ATP III CLASSIFICATION:  <200     mg/dL   Desirable  200-239  mg/dL   Borderline High  >=240    mg/dL   High          TRIG 62 10/10/2009   Lab Results  Component Value Date   HGBA1C * 09/28/2009    6.5 (NOTE)                                                                       According to  the ADA Clinical Practice Recommendations for 2011, when HbA1c is used as a screening test:   >=6.5%   Diagnostic of Diabetes Mellitus           (if abnormal result  is confirmed)  5.7-6.4%   Increased risk of developing Diabetes Mellitus  References:Diagnosis and Classification of Diabetes Mellitus,Diabetes [id number] 1):S62-S69 and Standards of Medical Care in         Diabetes - 2011,Diabetes Care,2011,34  (Suppl 1):S11-S61.   No results found for: VITAMINB12 No results found for: TSH   02/02/12 CT head [I reviewed images myself and agree with interpretation. -VRP]  1. No evidence of traumatic intracranial injury or fracture. 2. Moderate cortical volume loss and scattered small vessel ischemic microangiopathy.  11/12/06 MRI brain [I reviewed images myself and agree with interpretation. -VRP]  1. No acute intracranial abnormality detected. 2. Supratentorial small vessel disease, likely related to a combination of aging and adult onset of diabetes. 3. Age-appropriate atrophy without hydrocephalus, mass effect, or brain edema. 4. No cavernous sinus, orbital, skull base, or brainstem abnormality is detected which might suggest compression along the pathway of the sixth cranial nerves.     ASSESSMENT AND PLAN  82 y.o. year old male here with 2-3 months of numbness in fingertips. Most likely related to carpal tunnel syndrome with underlying peripheral neuropathy. Patient already has had carpal tunnel surgery on the right side as well as bilateral Dupuytren's contracture and steroid injections. Given patient's age, severity of symptoms, comorbid conditions, we decided to pursue a conservative course. EMG nerve conduction study, additional neuroimaging studies, not likely to affect management. Patient could consider referral to hand specialist for steroid injections and symptomatic treatment. Patient agrees  with plan.   Dx: hand numbness likely due to recurrent carpal tunnel  syndrome + peripheral neuropathy (due to diabetes and B12 deficiency)  1. Hand numbness   2. Hereditary and idiopathic peripheral neuropathy   3. Diabetic polyneuropathy associated with type 2 diabetes mellitus ([place])   4. B12 neuropathy   5. Bilateral carpal tunnel syndrome   6. Dupuytren's contracture of both hands      PLAN: - monitor symptoms - may consider referral to hand specialist / surgeon for steroid injections to treat: carpal tunnel syndrome and Dupuytren's contracture of both hands  Return if symptoms worsen or fail to improve, for return to PCP.    Penni Bombard, MD [id number], [id number] AM Certified in Neurology, Neurophysiology and Neuroimaging  [facility] [address], [place], [place] [address] [phone]

## 2015-07-26 DIAGNOSIS — E1142 Type 2 diabetes mellitus with diabetic polyneuropathy: Secondary | ICD-10-CM | POA: Diagnosis not present

## 2015-08-10 DIAGNOSIS — E119 Type 2 diabetes mellitus without complications: Secondary | ICD-10-CM | POA: Diagnosis not present

## 2015-12-07 DIAGNOSIS — E119 Type 2 diabetes mellitus without complications: Secondary | ICD-10-CM | POA: Diagnosis not present

## 2015-12-07 DIAGNOSIS — E559 Vitamin D deficiency, unspecified: Secondary | ICD-10-CM | POA: Diagnosis not present

## 2015-12-07 DIAGNOSIS — D291 Benign neoplasm of prostate: Secondary | ICD-10-CM | POA: Diagnosis not present

## 2015-12-07 DIAGNOSIS — E538 Deficiency of other specified B group vitamins: Secondary | ICD-10-CM | POA: Diagnosis not present

## 2015-12-07 DIAGNOSIS — E785 Hyperlipidemia, unspecified: Secondary | ICD-10-CM | POA: Diagnosis not present

## 2015-12-13 DIAGNOSIS — E1140 Type 2 diabetes mellitus with diabetic neuropathy, unspecified: Secondary | ICD-10-CM | POA: Diagnosis not present

## 2015-12-13 DIAGNOSIS — C189 Malignant neoplasm of colon, unspecified: Secondary | ICD-10-CM | POA: Diagnosis not present

## 2015-12-13 DIAGNOSIS — G6289 Other specified polyneuropathies: Secondary | ICD-10-CM | POA: Diagnosis not present

## 2015-12-13 DIAGNOSIS — N318 Other neuromuscular dysfunction of bladder: Secondary | ICD-10-CM | POA: Diagnosis not present

## 2015-12-13 DIAGNOSIS — Z0000 Encounter for general adult medical examination without abnormal findings: Secondary | ICD-10-CM | POA: Diagnosis not present

## 2015-12-13 DIAGNOSIS — R159 Full incontinence of feces: Secondary | ICD-10-CM | POA: Diagnosis not present

## 2015-12-13 DIAGNOSIS — R200 Anesthesia of skin: Secondary | ICD-10-CM | POA: Diagnosis not present

## 2015-12-13 DIAGNOSIS — H9193 Unspecified hearing loss, bilateral: Secondary | ICD-10-CM | POA: Diagnosis not present

## 2015-12-13 DIAGNOSIS — M810 Age-related osteoporosis without current pathological fracture: Secondary | ICD-10-CM | POA: Diagnosis not present

## 2015-12-13 DIAGNOSIS — Z23 Encounter for immunization: Secondary | ICD-10-CM | POA: Diagnosis not present

## 2015-12-13 DIAGNOSIS — N401 Enlarged prostate with lower urinary tract symptoms: Secondary | ICD-10-CM | POA: Diagnosis not present

## 2016-01-13 DIAGNOSIS — H401111 Primary open-angle glaucoma, right eye, mild stage: Secondary | ICD-10-CM | POA: Diagnosis not present

## 2016-01-13 DIAGNOSIS — E119 Type 2 diabetes mellitus without complications: Secondary | ICD-10-CM | POA: Diagnosis not present

## 2016-01-13 DIAGNOSIS — H401122 Primary open-angle glaucoma, left eye, moderate stage: Secondary | ICD-10-CM | POA: Diagnosis not present

## 2016-04-16 DIAGNOSIS — C189 Malignant neoplasm of colon, unspecified: Secondary | ICD-10-CM | POA: Diagnosis not present

## 2016-04-16 DIAGNOSIS — E1140 Type 2 diabetes mellitus with diabetic neuropathy, unspecified: Secondary | ICD-10-CM | POA: Diagnosis not present

## 2016-04-16 DIAGNOSIS — F0390 Unspecified dementia without behavioral disturbance: Secondary | ICD-10-CM | POA: Diagnosis not present

## 2016-04-16 DIAGNOSIS — M810 Age-related osteoporosis without current pathological fracture: Secondary | ICD-10-CM | POA: Diagnosis not present

## 2016-07-25 DIAGNOSIS — H401111 Primary open-angle glaucoma, right eye, mild stage: Secondary | ICD-10-CM | POA: Diagnosis not present

## 2016-07-25 DIAGNOSIS — H349 Unspecified retinal vascular occlusion: Secondary | ICD-10-CM | POA: Diagnosis not present

## 2016-08-07 DIAGNOSIS — C189 Malignant neoplasm of colon, unspecified: Secondary | ICD-10-CM | POA: Diagnosis not present

## 2016-08-07 DIAGNOSIS — R2689 Other abnormalities of gait and mobility: Secondary | ICD-10-CM | POA: Diagnosis not present

## 2016-08-07 DIAGNOSIS — F0390 Unspecified dementia without behavioral disturbance: Secondary | ICD-10-CM | POA: Diagnosis not present

## 2016-08-07 DIAGNOSIS — R4781 Slurred speech: Secondary | ICD-10-CM | POA: Diagnosis not present

## 2016-11-15 DIAGNOSIS — Z961 Presence of intraocular lens: Secondary | ICD-10-CM | POA: Diagnosis not present

## 2016-11-15 DIAGNOSIS — E113291 Type 2 diabetes mellitus with mild nonproliferative diabetic retinopathy without macular edema, right eye: Secondary | ICD-10-CM | POA: Diagnosis not present

## 2016-11-15 DIAGNOSIS — H401111 Primary open-angle glaucoma, right eye, mild stage: Secondary | ICD-10-CM | POA: Diagnosis not present

## 2016-12-26 DIAGNOSIS — Z23 Encounter for immunization: Secondary | ICD-10-CM | POA: Diagnosis not present

## 2017-01-22 DIAGNOSIS — E7849 Other hyperlipidemia: Secondary | ICD-10-CM | POA: Diagnosis not present

## 2017-01-22 DIAGNOSIS — M810 Age-related osteoporosis without current pathological fracture: Secondary | ICD-10-CM | POA: Diagnosis not present

## 2017-01-22 DIAGNOSIS — R82998 Other abnormal findings in urine: Secondary | ICD-10-CM | POA: Diagnosis not present

## 2017-01-22 DIAGNOSIS — E538 Deficiency of other specified B group vitamins: Secondary | ICD-10-CM | POA: Diagnosis not present

## 2017-01-22 DIAGNOSIS — Z125 Encounter for screening for malignant neoplasm of prostate: Secondary | ICD-10-CM | POA: Diagnosis not present

## 2017-01-29 DIAGNOSIS — C189 Malignant neoplasm of colon, unspecified: Secondary | ICD-10-CM | POA: Diagnosis not present

## 2017-01-29 DIAGNOSIS — R2689 Other abnormalities of gait and mobility: Secondary | ICD-10-CM | POA: Diagnosis not present

## 2017-01-29 DIAGNOSIS — F0390 Unspecified dementia without behavioral disturbance: Secondary | ICD-10-CM | POA: Diagnosis not present

## 2017-01-29 DIAGNOSIS — R82998 Other abnormal findings in urine: Secondary | ICD-10-CM | POA: Diagnosis not present

## 2017-01-29 DIAGNOSIS — Z0000 Encounter for general adult medical examination without abnormal findings: Secondary | ICD-10-CM | POA: Diagnosis not present

## 2017-02-28 ENCOUNTER — Emergency Department (HOSPITAL_COMMUNITY)
Admission: EM | Admit: 2017-02-28 | Discharge: 2017-02-28 | Disposition: A | Discharge status: Home / Self Care | Payer: Medicare Other | Attending: Emergency Medicine | Admitting: Emergency Medicine

## 2017-02-28 DIAGNOSIS — Z87891 Personal history of nicotine dependence: Secondary | ICD-10-CM | POA: Insufficient documentation

## 2017-02-28 DIAGNOSIS — Z79899 Other long term (current) drug therapy: Secondary | ICD-10-CM | POA: Insufficient documentation

## 2017-02-28 DIAGNOSIS — W010XXA Fall on same level from slipping, tripping and stumbling without subsequent striking against object, initial encounter: Secondary | ICD-10-CM | POA: Diagnosis not present

## 2017-02-28 DIAGNOSIS — Z7984 Long term (current) use of oral hypoglycemic drugs: Secondary | ICD-10-CM | POA: Diagnosis not present

## 2017-02-28 DIAGNOSIS — Y9389 Activity, other specified: Secondary | ICD-10-CM | POA: Insufficient documentation

## 2017-02-28 DIAGNOSIS — Y92128 Other place in nursing home as the place of occurrence of the external cause: Secondary | ICD-10-CM | POA: Diagnosis not present

## 2017-02-28 DIAGNOSIS — E108 Type 1 diabetes mellitus with unspecified complications: Secondary | ICD-10-CM | POA: Diagnosis not present

## 2017-02-28 DIAGNOSIS — Z794 Long term (current) use of insulin: Secondary | ICD-10-CM | POA: Diagnosis not present

## 2017-02-28 DIAGNOSIS — Z7982 Long term (current) use of aspirin: Secondary | ICD-10-CM | POA: Diagnosis not present

## 2017-02-28 DIAGNOSIS — R7309 Other abnormal glucose: Secondary | ICD-10-CM | POA: Diagnosis not present

## 2017-02-28 DIAGNOSIS — Y999 Unspecified external cause status: Secondary | ICD-10-CM | POA: Diagnosis not present

## 2017-02-28 DIAGNOSIS — E1165 Type 2 diabetes mellitus with hyperglycemia: Secondary | ICD-10-CM | POA: Insufficient documentation

## 2017-02-28 DIAGNOSIS — R739 Hyperglycemia, unspecified: Secondary | ICD-10-CM

## 2017-02-28 DIAGNOSIS — W19XXXA Unspecified fall, initial encounter: Secondary | ICD-10-CM

## 2017-02-28 LAB — CBG MONITORING, ED: Glucose-Capillary: 220 mg/dL — ABNORMAL HIGH (ref 65–99)

## 2017-02-28 MED ORDER — METFORMIN HCL 500 MG PO TABS: 500.0000 mg | ORAL_TABLET | Freq: Every day | ORAL | 0 refills | Status: AC

## 2017-02-28 NOTE — ED Notes (Signed)
Pt has no physical complaints at this time. EDP at bedside and provided patient with a walker. Patient able to ambulate without assistance with no acute needs discovered. Family at bedside with patient.

## 2017-02-28 NOTE — ED Notes (Signed)
ED Provider at bedside. 

## 2017-02-28 NOTE — ED Provider Notes (Signed)
[facility] DEPT Provider Note   CSN: [id number] Arrival date & time: 02/28/17  1502     History   Chief Complaint Chief Complaint  Patient presents with  . Fall    HPI Tony Moreno is a 83 y.o. male.  HPI  83 year old male presents after a trip and fall at his home.  Patient states that he was walking and trying to avoid something in the rug and then while going around it tripped and fell.  He denies hitting his head.  He denies any type of injury and states his fall was cushioned by the rug.  No pain.  He had trouble getting up initially which she states always happens whenever he falls.  He normally walks with a walker.  He denies any focal weakness or numbness.  His glucose was over 300 for EMS.  He has a history of type 2 diabetes but states he has not been on any type of diabetic medicine for a while now.  Because of his glucose being high he would like to restart metformin.  Past Medical History:  Diagnosis Date  . BPH (benign prostatic hypertrophy) 05/07/2011  . Colon cancer ([place]) 05/07/2011  . GERD (gastroesophageal reflux disease) 05/07/2011  . Hyperlipidemia 05/07/2011  . Osteoporosis 05/07/2011  . Peripheral neuropathy ([place]) 05/07/2011  . Type 2 diabetes mellitus ([place]) 05/07/2011    Patient Active Problem List   Diagnosis Date Noted  . Colon cancer ([place]) 05/07/2011  . Type 2 diabetes mellitus ([place]) 05/07/2011  . Peripheral neuropathy 05/07/2011  . GERD (gastroesophageal reflux disease) 05/07/2011  . Osteoporosis 05/07/2011  . BPH (benign prostatic hypertrophy) 05/07/2011  . Hyperlipidemia 05/07/2011  . ABDOMINAL PAIN-MULTIPLE SITES 11/10/2008  . ABNORMAL FINDINGS GI TRACT 11/10/2008    Past Surgical History:  Procedure Laterality Date  . APPENDECTOMY  1970s  . CHOLECYSTECTOMY  1970s  . COLON SURGERY  1970s. 2013 w colon resection to to surgery  . EYE SURGERY    . FRACTURE SURGERY     x2 femur w plates, fx left wrist- surgery  w  screws       Home Medications    Prior to Admission medications   Medication Sig Start Date End Date Taking? Authorizing Provider  aspirin 81 MG tablet Take 81 mg by mouth daily.    [provider]  buPROPion (WELLBUTRIN) 100 MG tablet Take 100 mg by mouth daily.    [provider]  calcium-vitamin D (OSCAL WITH D) 500-200 MG-UNIT per tablet Take 1 tablet by mouth daily.    [provider]  cholestyramine Lucrezia Starch) 4 G packet Take 1 packet by mouth as needed (for diarrhea).     [provider]  cyanocobalamin (,VITAMIN B-12,) 1000 MCG/ML injection Inject 1,000 mcg into the muscle every 14 (fourteen) days.    [provider]  famotidine (PEPCID) 20 MG tablet Take 20 mg by mouth daily.     [provider]  finasteride (PROSCAR) 5 MG tablet Take 5 mg by mouth daily.    [provider]  galantamine (RAZADYNE ER) 8 MG 24 hr capsule  05/31/15   [provider]  HYDROcodone-acetaminophen (NORCO) 5-325 MG per tablet Take 1 tablet by mouth 3 (three) times daily as needed for severe pain. 10/17/13   Orlie Dakin, MD  latanoprost (XALATAN) 0.005 % ophthalmic solution Place 1 drop into both eyes at bedtime.    [provider]  metFORMIN (GLUCOPHAGE) 500 MG tablet Take 1 tablet (500  mg total) by mouth daily with breakfast. 02/28/17   Sherwood Gambler, MD  metoCLOPramide (REGLAN) 10 MG tablet Take 10 mg by mouth 4 (four) times daily as needed for nausea or vomiting.     [provider]  Multiple Vitamin (MULTIVITAMIN) tablet Take 1 tablet by mouth daily.    [provider]  ondansetron (ZOFRAN) 8 MG tablet Take 1 tablet (8 mg total) by mouth every 8 (eight) hours as needed for nausea or vomiting. 10/17/13   Orlie Dakin, MD  pravastatin (PRAVACHOL) 20 MG tablet Take 20 mg by mouth daily.    [provider]  timolol (BETIMOL) 0.5 % ophthalmic solution Place 1 drop into the right eye daily.     [provider]  zoledronic acid (RECLAST) 5 MG/100ML SOLN injection Inject 5 mg into the vein once.    [provider]    Family History Family History  Problem Relation Age of Onset  . Heart attack Father   . Cancer Sister     Social History Social History   Tobacco Use  . Smoking status: Former Smoker    Packs/day: 3.00    Last attempt to quit: 12/15/1969    Years since quitting: 47.2  Substance Use Topics  . Alcohol use: Yes    Comment: glass of wine nightly  . Drug use: No     Allergies   Patient has no known allergies.   Review of Systems Review of Systems  Respiratory: Negative for shortness of breath.   Cardiovascular: Negative for chest pain.  Gastrointestinal: Negative for vomiting.  Musculoskeletal: Negative for arthralgias and back pain.  Neurological: Negative for weakness and headaches.  All other systems reviewed and are negative.    Physical Exam Updated Vital Signs BP 135/84 (BP Location: Right Arm)   Pulse 83   Temp 98.4 F (36.9 C) (Oral)   Resp 19   SpO2 99%   Physical Exam  Constitutional: He is oriented to person, place, and time. He appears well-developed and well-nourished.  HENT:  Head: Normocephalic and atraumatic.  Right Ear: External ear normal.  Left Ear: External ear normal.  Nose: Nose normal.  Eyes: EOM are normal. Right eye exhibits no discharge. Left eye exhibits no discharge.  Neck: Neck supple.  Cardiovascular: Normal rate, regular rhythm and normal heart sounds.  Pulmonary/Chest: Effort normal and breath sounds normal.  Abdominal: Soft. He exhibits no distension. There is no tenderness.  Musculoskeletal: He exhibits no edema.  No focal tenderness in all 4 extremities. Normal ROM of hips  Neurological: He is alert and oriented to person, place, and time.  Awake, alert, appropriate. Oriented to person, place and time (knows it's a couple days after Christmas but cannot remember exact date). CN 3-12  grossly intact. 5/5 strength in all 4 extremities. Grossly normal sensation. Normal ambulation with walker  Skin: Skin is warm and dry. He is not diaphoretic.  Nursing note and vitals reviewed.    ED Treatments / Results  Labs (all labs ordered are listed, but only abnormal results are displayed) Labs Reviewed  CBG MONITORING, ED - Abnormal; Notable for the following components:      Result Value   Glucose-Capillary 220 (*)    All other components within normal limits    EKG  EKG Interpretation None       Radiology No results found.  Procedures Procedures (including critical care time)  Medications Ordered in ED Medications - No data to display   Initial Impression /  Assessment and Plan / ED Course  I have reviewed the triage vital signs and the nursing notes.  Pertinent labs & imaging results that were available during my care of the patient were reviewed by me and considered in my medical decision making (see chart for details).     Patient is at mental baseline according to family who is at the bedside.  He was able to get up and ambulate with a walker with essentially no assistance.  He has no focal pain or signs of injury.  Does not appear that he lost consciousness or hit his head on the fall.  He is not on a blood thinner.  He would like to restart metformin given the hyperglycemia today, I will give him a prescription of what he used to be on, 500 mg in the morning.  Follow-up with PCP for better diabetic control.  No indication for imaging at this time.  Appears to be a mechanical fall.  Discharge home with family.  Final Clinical Impressions(s) / ED Diagnoses   Final diagnoses:  Fall, initial encounter  Hyperglycemia    ED Discharge Orders        Ordered    metFORMIN (GLUCOPHAGE) 500 MG tablet  Daily with breakfast     02/28/17 1555       Sherwood Gambler, MD 02/28/17 1611

## 2017-02-28 NOTE — ED Triage Notes (Signed)
Transported by [facility] from home for a fall. Patient is a resident of [facility]. EMS reports that he tripped and fell but denies hitting his head and denies any LOC. CBG with EMS read 311 mg/dl, hx of DM. Patient was ambulatory on scene (uses a walker.)

## 2017-05-16 DIAGNOSIS — H401111 Primary open-angle glaucoma, right eye, mild stage: Secondary | ICD-10-CM | POA: Diagnosis not present

## 2017-06-07 DIAGNOSIS — E538 Deficiency of other specified B group vitamins: Secondary | ICD-10-CM | POA: Diagnosis not present

## 2017-06-07 DIAGNOSIS — E1140 Type 2 diabetes mellitus with diabetic neuropathy, unspecified: Secondary | ICD-10-CM | POA: Diagnosis not present

## 2017-06-07 DIAGNOSIS — F0390 Unspecified dementia without behavioral disturbance: Secondary | ICD-10-CM | POA: Diagnosis not present

## 2017-06-07 DIAGNOSIS — R2689 Other abnormalities of gait and mobility: Secondary | ICD-10-CM | POA: Diagnosis not present

## 2017-10-25 DIAGNOSIS — E538 Deficiency of other specified B group vitamins: Secondary | ICD-10-CM | POA: Diagnosis not present

## 2017-10-25 DIAGNOSIS — R630 Anorexia: Secondary | ICD-10-CM | POA: Diagnosis not present

## 2017-10-25 DIAGNOSIS — F0390 Unspecified dementia without behavioral disturbance: Secondary | ICD-10-CM | POA: Diagnosis not present

## 2017-10-25 DIAGNOSIS — C189 Malignant neoplasm of colon, unspecified: Secondary | ICD-10-CM | POA: Diagnosis not present

## 2017-10-25 DIAGNOSIS — E1140 Type 2 diabetes mellitus with diabetic neuropathy, unspecified: Secondary | ICD-10-CM | POA: Diagnosis not present

## 2017-11-25 DIAGNOSIS — H349 Unspecified retinal vascular occlusion: Secondary | ICD-10-CM | POA: Diagnosis not present

## 2017-11-25 DIAGNOSIS — E119 Type 2 diabetes mellitus without complications: Secondary | ICD-10-CM | POA: Diagnosis not present

## 2017-11-25 DIAGNOSIS — H401111 Primary open-angle glaucoma, right eye, mild stage: Secondary | ICD-10-CM | POA: Diagnosis not present

## 2018-03-20 DIAGNOSIS — E559 Vitamin D deficiency, unspecified: Secondary | ICD-10-CM | POA: Diagnosis not present

## 2018-03-20 DIAGNOSIS — E538 Deficiency of other specified B group vitamins: Secondary | ICD-10-CM | POA: Diagnosis not present

## 2018-03-20 DIAGNOSIS — Z0000 Encounter for general adult medical examination without abnormal findings: Secondary | ICD-10-CM | POA: Diagnosis not present

## 2018-03-20 DIAGNOSIS — E1140 Type 2 diabetes mellitus with diabetic neuropathy, unspecified: Secondary | ICD-10-CM | POA: Diagnosis not present

## 2018-03-20 DIAGNOSIS — E7849 Other hyperlipidemia: Secondary | ICD-10-CM | POA: Diagnosis not present

## 2018-03-20 DIAGNOSIS — F0390 Unspecified dementia without behavioral disturbance: Secondary | ICD-10-CM | POA: Diagnosis not present

## 2018-03-20 DIAGNOSIS — R630 Anorexia: Secondary | ICD-10-CM | POA: Diagnosis not present

## 2018-03-20 DIAGNOSIS — R2689 Other abnormalities of gait and mobility: Secondary | ICD-10-CM | POA: Diagnosis not present

## 2018-06-02 DIAGNOSIS — R350 Frequency of micturition: Secondary | ICD-10-CM | POA: Diagnosis not present

## 2018-07-08 DIAGNOSIS — S40811A Abrasion of right upper arm, initial encounter: Secondary | ICD-10-CM | POA: Diagnosis not present

## 2018-07-11 DIAGNOSIS — Z5189 Encounter for other specified aftercare: Secondary | ICD-10-CM | POA: Diagnosis not present

## 2018-07-14 DIAGNOSIS — Z5189 Encounter for other specified aftercare: Secondary | ICD-10-CM | POA: Diagnosis not present

## 2018-07-16 DIAGNOSIS — Z5189 Encounter for other specified aftercare: Secondary | ICD-10-CM | POA: Diagnosis not present

## 2018-07-18 DIAGNOSIS — Z5189 Encounter for other specified aftercare: Secondary | ICD-10-CM | POA: Diagnosis not present

## 2018-07-21 DIAGNOSIS — Z5189 Encounter for other specified aftercare: Secondary | ICD-10-CM | POA: Diagnosis not present

## 2018-07-31 DIAGNOSIS — Z5189 Encounter for other specified aftercare: Secondary | ICD-10-CM | POA: Diagnosis not present

## 2018-08-13 DIAGNOSIS — B958 Unspecified staphylococcus as the cause of diseases classified elsewhere: Secondary | ICD-10-CM | POA: Diagnosis not present

## 2018-08-13 DIAGNOSIS — L308 Other specified dermatitis: Secondary | ICD-10-CM | POA: Diagnosis not present

## 2018-08-21 ENCOUNTER — Emergency Department (HOSPITAL_COMMUNITY): Payer: Medicare Other

## 2018-08-21 ENCOUNTER — Encounter (HOSPITAL_COMMUNITY): Payer: Self-pay

## 2018-08-21 ENCOUNTER — Emergency Department (HOSPITAL_COMMUNITY)
Admission: EM | Admit: 2018-08-21 | Discharge: 2018-08-21 | Disposition: A | Discharge status: Home / Self Care | Payer: Medicare Other | Attending: Emergency Medicine | Admitting: Emergency Medicine

## 2018-08-21 ENCOUNTER — Other Ambulatory Visit: Payer: Self-pay

## 2018-08-21 DIAGNOSIS — R05 Cough: Secondary | ICD-10-CM | POA: Diagnosis not present

## 2018-08-21 DIAGNOSIS — Z7984 Long term (current) use of oral hypoglycemic drugs: Secondary | ICD-10-CM | POA: Insufficient documentation

## 2018-08-21 DIAGNOSIS — Z79899 Other long term (current) drug therapy: Secondary | ICD-10-CM | POA: Insufficient documentation

## 2018-08-21 DIAGNOSIS — L089 Local infection of the skin and subcutaneous tissue, unspecified: Secondary | ICD-10-CM | POA: Diagnosis not present

## 2018-08-21 DIAGNOSIS — R531 Weakness: Secondary | ICD-10-CM | POA: Diagnosis not present

## 2018-08-21 DIAGNOSIS — R5381 Other malaise: Secondary | ICD-10-CM | POA: Diagnosis not present

## 2018-08-21 DIAGNOSIS — Z87891 Personal history of nicotine dependence: Secondary | ICD-10-CM | POA: Insufficient documentation

## 2018-08-21 DIAGNOSIS — R404 Transient alteration of awareness: Secondary | ICD-10-CM | POA: Diagnosis not present

## 2018-08-21 DIAGNOSIS — E1142 Type 2 diabetes mellitus with diabetic polyneuropathy: Secondary | ICD-10-CM | POA: Diagnosis not present

## 2018-08-21 DIAGNOSIS — Z7982 Long term (current) use of aspirin: Secondary | ICD-10-CM | POA: Insufficient documentation

## 2018-08-21 DIAGNOSIS — R059 Cough, unspecified: Secondary | ICD-10-CM

## 2018-08-21 DIAGNOSIS — R1310 Dysphagia, unspecified: Secondary | ICD-10-CM | POA: Diagnosis not present

## 2018-08-21 DIAGNOSIS — R0902 Hypoxemia: Secondary | ICD-10-CM | POA: Diagnosis not present

## 2018-08-21 LAB — CBC WITH DIFFERENTIAL/PLATELET
Abs Immature Granulocytes: 0.07 10*3/uL (ref 0.00–0.07)
Basophils Absolute: 0 10*3/uL (ref 0.0–0.1)
Basophils Relative: 0 %
Eosinophils Absolute: 0 10*3/uL (ref 0.0–0.5)
Eosinophils Relative: 0 %
HCT: 42.3 % (ref 39.0–52.0)
Hemoglobin: 14.3 g/dL (ref 13.0–17.0)
Immature Granulocytes: 1 %
Lymphocytes Relative: 10 %
Lymphs Abs: 1.2 10*3/uL (ref 0.7–4.0)
MCH: 32.7 pg (ref 26.0–34.0)
MCHC: 33.8 g/dL (ref 30.0–36.0)
MCV: 96.8 fL (ref 80.0–100.0)
Monocytes Absolute: 1 10*3/uL (ref 0.1–1.0)
Monocytes Relative: 9 %
Neutro Abs: 9.2 10*3/uL — ABNORMAL HIGH (ref 1.7–7.7)
Neutrophils Relative %: 80 %
Platelets: 168 10*3/uL (ref 150–400)
RBC: 4.37 MIL/uL (ref 4.22–5.81)
RDW: 12.2 % (ref 11.5–15.5)
WBC: 11.5 10*3/uL — ABNORMAL HIGH (ref 4.0–10.5)
nRBC: 0 % (ref 0.0–0.2)

## 2018-08-21 LAB — BASIC METABOLIC PANEL
Anion gap: 12 (ref 5–15)
BUN: 18 mg/dL (ref 8–23)
CO2: 25 mmol/L (ref 22–32)
Calcium: 8.9 mg/dL (ref 8.9–10.3)
Chloride: 99 mmol/L (ref 98–111)
Creatinine, Ser: 1 mg/dL (ref 0.61–1.24)
GFR calc Af Amer: >60 mL/min (ref >60)
GFR calc non Af Amer: >60 mL/min (ref >60)
Glucose, Bld: 246 mg/dL — ABNORMAL HIGH (ref 70–99)
Potassium: 4 mmol/L (ref 3.5–5.1)
Sodium: 136 mmol/L (ref 135–145)

## 2018-08-21 MED ORDER — SODIUM CHLORIDE 0.9 % IV SOLN
INTRAVENOUS | Status: DC
Start: 2018-08-21 — End: 2018-08-21
  Administered 2018-08-21: 17:00:00 via INTRAVENOUS

## 2018-08-21 NOTE — ED Notes (Signed)
Pt bladder scanned- 50 mL

## 2018-08-21 NOTE — ED Triage Notes (Signed)
Pt BIB EMS from assisted living. Nursing staff at facility reports sores on bilateral upper extremities that is infected. Pt has hx of difficulty swallowing and has worsened over past 24 hours. Pt HOH and reports 0/10 pain.  BP 121/70 HR 74

## 2018-08-21 NOTE — ED Notes (Signed)
Pt son, Terique Kawabata aware Dr. Zenia Resides will be discharging patient and per son- family member coming to pick up pt

## 2018-08-21 NOTE — Discharge Instructions (Addendum)
Follow-up with the dermatologist of your choice.  Return here for fever, inability ambulate, any other problems

## 2018-08-21 NOTE — ED Provider Notes (Signed)
[facility] DEPT Provider Note   CSN: [id number] Arrival date & time: 08/21/18  1351     History   Chief Complaint Chief Complaint  Patient presents with  . Sore  . Dysphagia    HPI CHRITOPHER COSTER is a 85 y.o. male.     85 year old male who presents from assisted living due to increased weakness as well as possible aspiration pneumonia.  He has a trouble swallowing for quite some time.  Spoke to patient's son who states that today they were trying to go to the doctor's office and he became profoundly weak.  No recent fever but did have emesis x1 today.  No focality to his symptoms in terms of weakness.  Patient also has had a chronic skin condition for several months.  And they were to be seen by Dr. for that.  Patient is very hard of hearing and history is all per the son.     Past Medical History:  Diagnosis Date  . BPH (benign prostatic hypertrophy) 05/07/2011  . Colon cancer ([place]) 05/07/2011  . GERD (gastroesophageal reflux disease) 05/07/2011  . Hyperlipidemia 05/07/2011  . Osteoporosis 05/07/2011  . Peripheral neuropathy 05/07/2011  . Type 2 diabetes mellitus ([place]) 05/07/2011    Patient Active Problem List   Diagnosis Date Noted  . Colon cancer ([place]) 05/07/2011  . Type 2 diabetes mellitus ([place]) 05/07/2011  . Peripheral neuropathy 05/07/2011  . GERD (gastroesophageal reflux disease) 05/07/2011  . Osteoporosis 05/07/2011  . BPH (benign prostatic hypertrophy) 05/07/2011  . Hyperlipidemia 05/07/2011  . ABDOMINAL PAIN-MULTIPLE SITES 11/10/2008  . ABNORMAL FINDINGS GI TRACT 11/10/2008    Past Surgical History:  Procedure Laterality Date  . APPENDECTOMY  1970s  . CHOLECYSTECTOMY  1970s  . COLON SURGERY  1970s. 2013 w colon resection to to surgery  . EYE SURGERY    . FRACTURE SURGERY     x2 femur w plates, fx left wrist- surgery  w screws        Home Medications    Prior to Admission medications   Medication Sig Start Date End Date  Taking? Authorizing Provider  aspirin 81 MG tablet Take 81 mg by mouth daily.    [provider]  buPROPion (WELLBUTRIN) 100 MG tablet Take 100 mg by mouth daily.    [provider]  calcium-vitamin D (OSCAL WITH D) 500-200 MG-UNIT per tablet Take 1 tablet by mouth daily.    [provider]  cholestyramine Lucrezia Starch) 4 G packet Take 1 packet by mouth as needed (for diarrhea).     [provider]  cyanocobalamin (,VITAMIN B-12,) 1000 MCG/ML injection Inject 1,000 mcg into the muscle every 14 (fourteen) days.    [provider]  famotidine (PEPCID) 20 MG tablet Take 20 mg by mouth daily.     [provider]  finasteride (PROSCAR) 5 MG tablet Take 5 mg by mouth daily.    [provider]  galantamine (RAZADYNE ER) 8 MG 24 hr capsule  05/31/15   [provider]  HYDROcodone-acetaminophen (NORCO) 5-325 MG per tablet Take 1 tablet by mouth 3 (three) times daily as needed for severe pain. 10/17/13   Orlie Dakin, MD  latanoprost (XALATAN) 0.005 % ophthalmic solution Place 1 drop into both eyes at bedtime.    [provider]  metFORMIN (GLUCOPHAGE) 500 MG tablet Take 1 tablet (500 mg total) by mouth daily with breakfast. 02/28/17   Sherwood Gambler, MD  metoCLOPramide (REGLAN) 10 MG tablet Take 10 mg  by mouth 4 (four) times daily as needed for nausea or vomiting.     [provider]  Multiple Vitamin (MULTIVITAMIN) tablet Take 1 tablet by mouth daily.    [provider]  ondansetron (ZOFRAN) 8 MG tablet Take 1 tablet (8 mg total) by mouth every 8 (eight) hours as needed for nausea or vomiting. 10/17/13   Orlie Dakin, MD  pravastatin (PRAVACHOL) 20 MG tablet Take 20 mg by mouth daily.    [provider]  timolol (BETIMOL) 0.5 % ophthalmic solution Place 1 drop into the right eye daily.    [provider]  zoledronic acid (RECLAST) 5 MG/100ML SOLN injection Inject 5 mg into the vein once.     [provider]    Family History Family History  Problem Relation Age of Onset  . Heart attack Father   . Cancer Sister     Social History Social History   Tobacco Use  . Smoking status: Former Smoker    Packs/day: 3.00    Quit date: 12/15/1969    Years since quitting: 48.7  Substance Use Topics  . Alcohol use: Yes    Comment: glass of wine nightly  . Drug use: No     Allergies   Patient has no known allergies.   Review of Systems Review of Systems  All other systems reviewed and are negative.    Physical Exam Updated Vital Signs BP (!) 134/95   Pulse 73   Temp 98.5 F (36.9 C) (Oral)   Resp 18   SpO2 97%   Physical Exam Vitals signs and nursing note reviewed.  Constitutional:      General: He is not in acute distress.    Appearance: Normal appearance. He is well-developed. He is not toxic-appearing.  HENT:     Head: Normocephalic and atraumatic.  Eyes:     General: Lids are normal.     Conjunctiva/sclera: Conjunctivae normal.     Pupils: Pupils are equal, round, and reactive to light.  Neck:     Musculoskeletal: Normal range of motion and neck supple.     Thyroid: No thyroid mass.     Trachea: No tracheal deviation.  Cardiovascular:     Rate and Rhythm: Normal rate and regular rhythm.     Heart sounds: Normal heart sounds. No murmur. No gallop.   Pulmonary:     Effort: Pulmonary effort is normal. No respiratory distress.     Breath sounds: No stridor. Decreased breath sounds present. No wheezing, rhonchi or rales.  Abdominal:     General: Bowel sounds are normal. There is no distension.     Palpations: Abdomen is soft.     Tenderness: There is no abdominal tenderness. There is no rebound.  Musculoskeletal: Normal range of motion.        General: No tenderness.  Skin:    General: Skin is warm and dry.     Findings: Rash present. No abrasion. Rash is macular. Rash is not crusting, pustular or urticarial.  Neurological:     Mental  Status: He is alert and oriented to person, place, and time.     GCS: GCS eye subscore is 4. GCS verbal subscore is 5. GCS motor subscore is 6.     Cranial Nerves: No cranial nerve deficit.     Sensory: No sensory deficit.  Psychiatric:        Attention and Perception: He is attentive.        Speech: Speech normal.  Behavior: Behavior is slowed.      ED Treatments / Results  Labs (all labs ordered are listed, but only abnormal results are displayed) Labs Reviewed - No data to display  EKG    Radiology No results found.  Procedures Procedures (including critical care time)  Medications Ordered in ED Medications - No data to display   Initial Impression / Assessment and Plan / ED Course  I have reviewed the triage vital signs and the nursing notes.  Pertinent labs & imaging results that were available during my care of the patient were reviewed by me and considered in my medical decision making (see chart for details).        Patient able to ambulate 2 steps with assistance.  Head CT negative.  bMet within normal limits.  Patient is afebrile here.  Long discussion with patient's power of attorney about patient's current condition including his skin findings and work-up in the ER is reassuring.  Was instructed to follow-up with a dermatologist of his choice  Final Clinical Impressions(s) / ED Diagnoses   Final diagnoses:  None    ED Discharge Orders    None       Lacretia Leigh, MD 08/21/18 1955

## 2018-08-28 DIAGNOSIS — K219 Gastro-esophageal reflux disease without esophagitis: Secondary | ICD-10-CM | POA: Diagnosis not present

## 2018-08-28 DIAGNOSIS — R54 Age-related physical debility: Secondary | ICD-10-CM | POA: Diagnosis not present

## 2018-08-28 DIAGNOSIS — G301 Alzheimer's disease with late onset: Secondary | ICD-10-CM | POA: Diagnosis not present

## 2018-08-28 DIAGNOSIS — N400 Enlarged prostate without lower urinary tract symptoms: Secondary | ICD-10-CM | POA: Diagnosis not present

## 2018-08-29 DIAGNOSIS — F0280 Dementia in other diseases classified elsewhere without behavioral disturbance: Secondary | ICD-10-CM | POA: Diagnosis not present

## 2018-08-29 DIAGNOSIS — E538 Deficiency of other specified B group vitamins: Secondary | ICD-10-CM | POA: Diagnosis not present

## 2018-08-29 DIAGNOSIS — M6281 Muscle weakness (generalized): Secondary | ICD-10-CM | POA: Diagnosis not present

## 2018-08-29 DIAGNOSIS — E1142 Type 2 diabetes mellitus with diabetic polyneuropathy: Secondary | ICD-10-CM | POA: Diagnosis not present

## 2018-08-29 DIAGNOSIS — R2681 Unsteadiness on feet: Secondary | ICD-10-CM | POA: Diagnosis not present

## 2018-09-01 DIAGNOSIS — F0280 Dementia in other diseases classified elsewhere without behavioral disturbance: Secondary | ICD-10-CM | POA: Diagnosis not present

## 2018-09-01 DIAGNOSIS — R2681 Unsteadiness on feet: Secondary | ICD-10-CM | POA: Diagnosis not present

## 2018-09-01 DIAGNOSIS — E1142 Type 2 diabetes mellitus with diabetic polyneuropathy: Secondary | ICD-10-CM | POA: Diagnosis not present

## 2018-09-01 DIAGNOSIS — E538 Deficiency of other specified B group vitamins: Secondary | ICD-10-CM | POA: Diagnosis not present

## 2018-09-01 DIAGNOSIS — M6281 Muscle weakness (generalized): Secondary | ICD-10-CM | POA: Diagnosis not present

## 2018-09-02 DIAGNOSIS — R2681 Unsteadiness on feet: Secondary | ICD-10-CM | POA: Diagnosis not present

## 2018-09-02 DIAGNOSIS — M6281 Muscle weakness (generalized): Secondary | ICD-10-CM | POA: Diagnosis not present

## 2018-09-02 DIAGNOSIS — F0280 Dementia in other diseases classified elsewhere without behavioral disturbance: Secondary | ICD-10-CM | POA: Diagnosis not present

## 2018-09-02 DIAGNOSIS — E1142 Type 2 diabetes mellitus with diabetic polyneuropathy: Secondary | ICD-10-CM | POA: Diagnosis not present

## 2018-09-02 DIAGNOSIS — E538 Deficiency of other specified B group vitamins: Secondary | ICD-10-CM | POA: Diagnosis not present

## 2018-09-03 DIAGNOSIS — F0280 Dementia in other diseases classified elsewhere without behavioral disturbance: Secondary | ICD-10-CM | POA: Diagnosis not present

## 2018-09-03 DIAGNOSIS — M6281 Muscle weakness (generalized): Secondary | ICD-10-CM | POA: Diagnosis not present

## 2018-09-03 DIAGNOSIS — E1142 Type 2 diabetes mellitus with diabetic polyneuropathy: Secondary | ICD-10-CM | POA: Diagnosis not present

## 2018-09-03 DIAGNOSIS — B3742 Candidal balanitis: Secondary | ICD-10-CM | POA: Diagnosis not present

## 2018-09-03 DIAGNOSIS — R300 Dysuria: Secondary | ICD-10-CM | POA: Diagnosis not present

## 2018-09-03 DIAGNOSIS — E538 Deficiency of other specified B group vitamins: Secondary | ICD-10-CM | POA: Diagnosis not present

## 2018-09-03 DIAGNOSIS — R319 Hematuria, unspecified: Secondary | ICD-10-CM | POA: Diagnosis not present

## 2018-09-03 DIAGNOSIS — R2681 Unsteadiness on feet: Secondary | ICD-10-CM | POA: Diagnosis not present

## 2018-09-04 DIAGNOSIS — M6281 Muscle weakness (generalized): Secondary | ICD-10-CM | POA: Diagnosis not present

## 2018-09-04 DIAGNOSIS — E538 Deficiency of other specified B group vitamins: Secondary | ICD-10-CM | POA: Diagnosis not present

## 2018-09-04 DIAGNOSIS — R2681 Unsteadiness on feet: Secondary | ICD-10-CM | POA: Diagnosis not present

## 2018-09-04 DIAGNOSIS — F0280 Dementia in other diseases classified elsewhere without behavioral disturbance: Secondary | ICD-10-CM | POA: Diagnosis not present

## 2018-09-04 DIAGNOSIS — E1142 Type 2 diabetes mellitus with diabetic polyneuropathy: Secondary | ICD-10-CM | POA: Diagnosis not present

## 2018-09-05 DIAGNOSIS — E538 Deficiency of other specified B group vitamins: Secondary | ICD-10-CM | POA: Diagnosis not present

## 2018-09-05 DIAGNOSIS — R2681 Unsteadiness on feet: Secondary | ICD-10-CM | POA: Diagnosis not present

## 2018-09-05 DIAGNOSIS — F0280 Dementia in other diseases classified elsewhere without behavioral disturbance: Secondary | ICD-10-CM | POA: Diagnosis not present

## 2018-09-05 DIAGNOSIS — E1142 Type 2 diabetes mellitus with diabetic polyneuropathy: Secondary | ICD-10-CM | POA: Diagnosis not present

## 2018-09-05 DIAGNOSIS — M6281 Muscle weakness (generalized): Secondary | ICD-10-CM | POA: Diagnosis not present

## 2018-09-08 DIAGNOSIS — D538 Other specified nutritional anemias: Secondary | ICD-10-CM | POA: Diagnosis not present

## 2018-09-08 DIAGNOSIS — E559 Vitamin D deficiency, unspecified: Secondary | ICD-10-CM | POA: Diagnosis not present

## 2018-09-08 DIAGNOSIS — F0280 Dementia in other diseases classified elsewhere without behavioral disturbance: Secondary | ICD-10-CM | POA: Diagnosis not present

## 2018-09-08 DIAGNOSIS — E785 Hyperlipidemia, unspecified: Secondary | ICD-10-CM | POA: Diagnosis not present

## 2018-09-08 DIAGNOSIS — N390 Urinary tract infection, site not specified: Secondary | ICD-10-CM | POA: Diagnosis not present

## 2018-09-08 DIAGNOSIS — M6281 Muscle weakness (generalized): Secondary | ICD-10-CM | POA: Diagnosis not present

## 2018-09-08 DIAGNOSIS — E1142 Type 2 diabetes mellitus with diabetic polyneuropathy: Secondary | ICD-10-CM | POA: Diagnosis not present

## 2018-09-08 DIAGNOSIS — R2681 Unsteadiness on feet: Secondary | ICD-10-CM | POA: Diagnosis not present

## 2018-09-08 DIAGNOSIS — E538 Deficiency of other specified B group vitamins: Secondary | ICD-10-CM | POA: Diagnosis not present

## 2018-09-09 DIAGNOSIS — M6281 Muscle weakness (generalized): Secondary | ICD-10-CM | POA: Diagnosis not present

## 2018-09-09 DIAGNOSIS — R2681 Unsteadiness on feet: Secondary | ICD-10-CM | POA: Diagnosis not present

## 2018-09-09 DIAGNOSIS — E1142 Type 2 diabetes mellitus with diabetic polyneuropathy: Secondary | ICD-10-CM | POA: Diagnosis not present

## 2018-09-09 DIAGNOSIS — E538 Deficiency of other specified B group vitamins: Secondary | ICD-10-CM | POA: Diagnosis not present

## 2018-09-09 DIAGNOSIS — R319 Hematuria, unspecified: Secondary | ICD-10-CM | POA: Diagnosis not present

## 2018-09-09 DIAGNOSIS — F0280 Dementia in other diseases classified elsewhere without behavioral disturbance: Secondary | ICD-10-CM | POA: Diagnosis not present

## 2018-09-10 DIAGNOSIS — R2681 Unsteadiness on feet: Secondary | ICD-10-CM | POA: Diagnosis not present

## 2018-09-10 DIAGNOSIS — F0280 Dementia in other diseases classified elsewhere without behavioral disturbance: Secondary | ICD-10-CM | POA: Diagnosis not present

## 2018-09-10 DIAGNOSIS — R319 Hematuria, unspecified: Secondary | ICD-10-CM | POA: Diagnosis not present

## 2018-09-10 DIAGNOSIS — F419 Anxiety disorder, unspecified: Secondary | ICD-10-CM | POA: Diagnosis not present

## 2018-09-10 DIAGNOSIS — N390 Urinary tract infection, site not specified: Secondary | ICD-10-CM | POA: Diagnosis not present

## 2018-09-10 DIAGNOSIS — E538 Deficiency of other specified B group vitamins: Secondary | ICD-10-CM | POA: Diagnosis not present

## 2018-09-10 DIAGNOSIS — E1142 Type 2 diabetes mellitus with diabetic polyneuropathy: Secondary | ICD-10-CM | POA: Diagnosis not present

## 2018-09-10 DIAGNOSIS — M6281 Muscle weakness (generalized): Secondary | ICD-10-CM | POA: Diagnosis not present

## 2018-09-11 DIAGNOSIS — E538 Deficiency of other specified B group vitamins: Secondary | ICD-10-CM | POA: Diagnosis not present

## 2018-09-11 DIAGNOSIS — F0280 Dementia in other diseases classified elsewhere without behavioral disturbance: Secondary | ICD-10-CM | POA: Diagnosis not present

## 2018-09-11 DIAGNOSIS — R2681 Unsteadiness on feet: Secondary | ICD-10-CM | POA: Diagnosis not present

## 2018-09-11 DIAGNOSIS — E1142 Type 2 diabetes mellitus with diabetic polyneuropathy: Secondary | ICD-10-CM | POA: Diagnosis not present

## 2018-09-11 DIAGNOSIS — M6281 Muscle weakness (generalized): Secondary | ICD-10-CM | POA: Diagnosis not present

## 2018-09-12 DIAGNOSIS — R2681 Unsteadiness on feet: Secondary | ICD-10-CM | POA: Diagnosis not present

## 2018-09-12 DIAGNOSIS — E538 Deficiency of other specified B group vitamins: Secondary | ICD-10-CM | POA: Diagnosis not present

## 2018-09-12 DIAGNOSIS — E1142 Type 2 diabetes mellitus with diabetic polyneuropathy: Secondary | ICD-10-CM | POA: Diagnosis not present

## 2018-09-12 DIAGNOSIS — M6281 Muscle weakness (generalized): Secondary | ICD-10-CM | POA: Diagnosis not present

## 2018-09-12 DIAGNOSIS — F0280 Dementia in other diseases classified elsewhere without behavioral disturbance: Secondary | ICD-10-CM | POA: Diagnosis not present

## 2018-09-15 DIAGNOSIS — E538 Deficiency of other specified B group vitamins: Secondary | ICD-10-CM | POA: Diagnosis not present

## 2018-09-15 DIAGNOSIS — F0280 Dementia in other diseases classified elsewhere without behavioral disturbance: Secondary | ICD-10-CM | POA: Diagnosis not present

## 2018-09-15 DIAGNOSIS — R2681 Unsteadiness on feet: Secondary | ICD-10-CM | POA: Diagnosis not present

## 2018-09-15 DIAGNOSIS — M6281 Muscle weakness (generalized): Secondary | ICD-10-CM | POA: Diagnosis not present

## 2018-09-15 DIAGNOSIS — E1142 Type 2 diabetes mellitus with diabetic polyneuropathy: Secondary | ICD-10-CM | POA: Diagnosis not present

## 2018-09-16 DIAGNOSIS — E538 Deficiency of other specified B group vitamins: Secondary | ICD-10-CM | POA: Diagnosis not present

## 2018-09-16 DIAGNOSIS — F0280 Dementia in other diseases classified elsewhere without behavioral disturbance: Secondary | ICD-10-CM | POA: Diagnosis not present

## 2018-09-16 DIAGNOSIS — E1142 Type 2 diabetes mellitus with diabetic polyneuropathy: Secondary | ICD-10-CM | POA: Diagnosis not present

## 2018-09-16 DIAGNOSIS — R2681 Unsteadiness on feet: Secondary | ICD-10-CM | POA: Diagnosis not present

## 2018-09-16 DIAGNOSIS — M6281 Muscle weakness (generalized): Secondary | ICD-10-CM | POA: Diagnosis not present

## 2018-09-18 DIAGNOSIS — E538 Deficiency of other specified B group vitamins: Secondary | ICD-10-CM | POA: Diagnosis not present

## 2018-09-18 DIAGNOSIS — M6281 Muscle weakness (generalized): Secondary | ICD-10-CM | POA: Diagnosis not present

## 2018-09-18 DIAGNOSIS — R2681 Unsteadiness on feet: Secondary | ICD-10-CM | POA: Diagnosis not present

## 2018-09-18 DIAGNOSIS — F0280 Dementia in other diseases classified elsewhere without behavioral disturbance: Secondary | ICD-10-CM | POA: Diagnosis not present

## 2018-09-18 DIAGNOSIS — E1142 Type 2 diabetes mellitus with diabetic polyneuropathy: Secondary | ICD-10-CM | POA: Diagnosis not present

## 2018-09-19 DIAGNOSIS — M6281 Muscle weakness (generalized): Secondary | ICD-10-CM | POA: Diagnosis not present

## 2018-09-19 DIAGNOSIS — E538 Deficiency of other specified B group vitamins: Secondary | ICD-10-CM | POA: Diagnosis not present

## 2018-09-19 DIAGNOSIS — F0280 Dementia in other diseases classified elsewhere without behavioral disturbance: Secondary | ICD-10-CM | POA: Diagnosis not present

## 2018-09-19 DIAGNOSIS — R2681 Unsteadiness on feet: Secondary | ICD-10-CM | POA: Diagnosis not present

## 2018-09-19 DIAGNOSIS — E1142 Type 2 diabetes mellitus with diabetic polyneuropathy: Secondary | ICD-10-CM | POA: Diagnosis not present

## 2018-09-22 DIAGNOSIS — E1142 Type 2 diabetes mellitus with diabetic polyneuropathy: Secondary | ICD-10-CM | POA: Diagnosis not present

## 2018-09-22 DIAGNOSIS — F0280 Dementia in other diseases classified elsewhere without behavioral disturbance: Secondary | ICD-10-CM | POA: Diagnosis not present

## 2018-09-22 DIAGNOSIS — M6281 Muscle weakness (generalized): Secondary | ICD-10-CM | POA: Diagnosis not present

## 2018-09-22 DIAGNOSIS — R2681 Unsteadiness on feet: Secondary | ICD-10-CM | POA: Diagnosis not present

## 2018-09-22 DIAGNOSIS — E538 Deficiency of other specified B group vitamins: Secondary | ICD-10-CM | POA: Diagnosis not present

## 2018-09-23 DIAGNOSIS — F0280 Dementia in other diseases classified elsewhere without behavioral disturbance: Secondary | ICD-10-CM | POA: Diagnosis not present

## 2018-09-23 DIAGNOSIS — E1142 Type 2 diabetes mellitus with diabetic polyneuropathy: Secondary | ICD-10-CM | POA: Diagnosis not present

## 2018-09-23 DIAGNOSIS — R2681 Unsteadiness on feet: Secondary | ICD-10-CM | POA: Diagnosis not present

## 2018-09-23 DIAGNOSIS — E538 Deficiency of other specified B group vitamins: Secondary | ICD-10-CM | POA: Diagnosis not present

## 2018-09-23 DIAGNOSIS — M6281 Muscle weakness (generalized): Secondary | ICD-10-CM | POA: Diagnosis not present

## 2018-09-25 DIAGNOSIS — M6281 Muscle weakness (generalized): Secondary | ICD-10-CM | POA: Diagnosis not present

## 2018-09-25 DIAGNOSIS — R2681 Unsteadiness on feet: Secondary | ICD-10-CM | POA: Diagnosis not present

## 2018-09-25 DIAGNOSIS — F0280 Dementia in other diseases classified elsewhere without behavioral disturbance: Secondary | ICD-10-CM | POA: Diagnosis not present

## 2018-09-25 DIAGNOSIS — E538 Deficiency of other specified B group vitamins: Secondary | ICD-10-CM | POA: Diagnosis not present

## 2018-09-25 DIAGNOSIS — E1142 Type 2 diabetes mellitus with diabetic polyneuropathy: Secondary | ICD-10-CM | POA: Diagnosis not present

## 2018-09-26 DIAGNOSIS — M6281 Muscle weakness (generalized): Secondary | ICD-10-CM | POA: Diagnosis not present

## 2018-09-26 DIAGNOSIS — F0280 Dementia in other diseases classified elsewhere without behavioral disturbance: Secondary | ICD-10-CM | POA: Diagnosis not present

## 2018-09-26 DIAGNOSIS — E1142 Type 2 diabetes mellitus with diabetic polyneuropathy: Secondary | ICD-10-CM | POA: Diagnosis not present

## 2018-09-26 DIAGNOSIS — R2681 Unsteadiness on feet: Secondary | ICD-10-CM | POA: Diagnosis not present

## 2018-09-26 DIAGNOSIS — E538 Deficiency of other specified B group vitamins: Secondary | ICD-10-CM | POA: Diagnosis not present

## 2018-09-29 DIAGNOSIS — B342 Coronavirus infection, unspecified: Secondary | ICD-10-CM | POA: Diagnosis not present

## 2018-10-01 DIAGNOSIS — E1165 Type 2 diabetes mellitus with hyperglycemia: Secondary | ICD-10-CM | POA: Diagnosis not present

## 2018-10-01 DIAGNOSIS — R739 Hyperglycemia, unspecified: Secondary | ICD-10-CM | POA: Diagnosis not present

## 2018-10-03 DIAGNOSIS — N390 Urinary tract infection, site not specified: Secondary | ICD-10-CM | POA: Diagnosis not present

## 2018-10-07 ENCOUNTER — Other Ambulatory Visit: Payer: Self-pay

## 2018-10-07 ENCOUNTER — Emergency Department (HOSPITAL_COMMUNITY): Payer: Medicare Other

## 2018-10-07 ENCOUNTER — Emergency Department (HOSPITAL_COMMUNITY)
Admission: EM | Admit: 2018-10-07 | Discharge: 2018-10-08 | Disposition: A | Discharge status: Home / Self Care | Payer: Medicare Other | Attending: Emergency Medicine | Admitting: Emergency Medicine

## 2018-10-07 DIAGNOSIS — G309 Alzheimer's disease, unspecified: Secondary | ICD-10-CM | POA: Diagnosis not present

## 2018-10-07 DIAGNOSIS — N3001 Acute cystitis with hematuria: Secondary | ICD-10-CM | POA: Diagnosis not present

## 2018-10-07 DIAGNOSIS — Z87891 Personal history of nicotine dependence: Secondary | ICD-10-CM | POA: Insufficient documentation

## 2018-10-07 DIAGNOSIS — E119 Type 2 diabetes mellitus without complications: Secondary | ICD-10-CM | POA: Insufficient documentation

## 2018-10-07 DIAGNOSIS — E1165 Type 2 diabetes mellitus with hyperglycemia: Secondary | ICD-10-CM | POA: Diagnosis not present

## 2018-10-07 DIAGNOSIS — N3000 Acute cystitis without hematuria: Secondary | ICD-10-CM | POA: Diagnosis not present

## 2018-10-07 DIAGNOSIS — Z23 Encounter for immunization: Secondary | ICD-10-CM | POA: Diagnosis not present

## 2018-10-07 DIAGNOSIS — Y9389 Activity, other specified: Secondary | ICD-10-CM | POA: Insufficient documentation

## 2018-10-07 DIAGNOSIS — R51 Headache: Secondary | ICD-10-CM | POA: Diagnosis not present

## 2018-10-07 DIAGNOSIS — Z7982 Long term (current) use of aspirin: Secondary | ICD-10-CM | POA: Diagnosis not present

## 2018-10-07 DIAGNOSIS — R54 Age-related physical debility: Secondary | ICD-10-CM | POA: Diagnosis not present

## 2018-10-07 DIAGNOSIS — S0990XA Unspecified injury of head, initial encounter: Secondary | ICD-10-CM | POA: Diagnosis not present

## 2018-10-07 DIAGNOSIS — W050XXA Fall from non-moving wheelchair, initial encounter: Secondary | ICD-10-CM | POA: Insufficient documentation

## 2018-10-07 DIAGNOSIS — Z794 Long term (current) use of insulin: Secondary | ICD-10-CM | POA: Insufficient documentation

## 2018-10-07 DIAGNOSIS — Y999 Unspecified external cause status: Secondary | ICD-10-CM | POA: Insufficient documentation

## 2018-10-07 DIAGNOSIS — R001 Bradycardia, unspecified: Secondary | ICD-10-CM | POA: Diagnosis not present

## 2018-10-07 DIAGNOSIS — Z79899 Other long term (current) drug therapy: Secondary | ICD-10-CM | POA: Diagnosis not present

## 2018-10-07 DIAGNOSIS — R000 Tachycardia, unspecified: Secondary | ICD-10-CM | POA: Diagnosis not present

## 2018-10-07 DIAGNOSIS — Y92128 Other place in nursing home as the place of occurrence of the external cause: Secondary | ICD-10-CM | POA: Diagnosis not present

## 2018-10-07 DIAGNOSIS — W19XXXA Unspecified fall, initial encounter: Secondary | ICD-10-CM

## 2018-10-07 DIAGNOSIS — S199XXA Unspecified injury of neck, initial encounter: Secondary | ICD-10-CM | POA: Diagnosis not present

## 2018-10-07 LAB — CBC WITH DIFFERENTIAL/PLATELET
Abs Immature Granulocytes: 0.05 10*3/uL (ref 0.00–0.07)
Basophils Absolute: 0 10*3/uL (ref 0.0–0.1)
Basophils Relative: 0 %
Eosinophils Absolute: 0 10*3/uL (ref 0.0–0.5)
Eosinophils Relative: 0 %
HCT: 34.2 % — ABNORMAL LOW (ref 39.0–52.0)
Hemoglobin: 11.6 g/dL — ABNORMAL LOW (ref 13.0–17.0)
Immature Granulocytes: 1 %
Lymphocytes Relative: 11 %
Lymphs Abs: 1.1 10*3/uL (ref 0.7–4.0)
MCH: 32.4 pg (ref 26.0–34.0)
MCHC: 33.9 g/dL (ref 30.0–36.0)
MCV: 95.5 fL (ref 80.0–100.0)
Monocytes Absolute: 0.9 10*3/uL (ref 0.1–1.0)
Monocytes Relative: 9 %
Neutro Abs: 8.1 10*3/uL — ABNORMAL HIGH (ref 1.7–7.7)
Neutrophils Relative %: 79 %
Platelets: 241 10*3/uL (ref 150–400)
RBC: 3.58 MIL/uL — ABNORMAL LOW (ref 4.22–5.81)
RDW: 12.1 % (ref 11.5–15.5)
WBC: 10.2 10*3/uL (ref 4.0–10.5)
nRBC: 0 % (ref 0.0–0.2)

## 2018-10-07 MED ORDER — TETANUS-DIPHTH-ACELL PERTUSSIS 5-2.5-18.5 LF-MCG/0.5 IM SUSP
0.5000 mL | Freq: Once | INTRAMUSCULAR | Status: AC
  Administered 2018-10-07: 0.5 mL via INTRAMUSCULAR
  Filled 2018-10-07: qty 0.5

## 2018-10-07 NOTE — ED Triage Notes (Addendum)
Pt arrived via [facility] after falling forward out of his wheelchair. Small skin tear to right elbow. Per EMS nursing home called due to patient seeming quieter than normal. Pt has a history of dementia and EMS reports he is at his baseline. Staff declines hitting his head or LOC

## 2018-10-07 NOTE — ED Provider Notes (Signed)
TIME SEEN: 11:31 PM  CHIEF COMPLAINT: Fall  HPI: Patient is a 85 y.o. male with history of diabetes, hyperlipidemia, dementia who presents to the emergency department after he had a fall.  The patient lives at [facility].  Spoke with nursing staff there who states that patient is mostly bedbound and wheelchair-bound.  They state he was in his wheelchair when he leaned forward falling out of the wheelchair landing on the ground.  He states he did hit his face on the ground.  There is no loss of consciousness.  He is not on blood thinners.  His son who is a radiologist is at the bedside.  He states that he feels his father has decompensated over the past several months.  He states he has not seen his father in 2 months secondary to COVID-19.  He states that the patient was recently started on insulin.  He was recently treated for urinary tract infection.  No other new medication changes.  ROS: Level V caveat due to dementia  PAST MEDICAL HISTORY/PAST SURGICAL HISTORY:  Past Medical History:  Diagnosis Date  . BPH (benign prostatic hypertrophy) 05/07/2011  . Colon cancer ([place]) 05/07/2011  . GERD (gastroesophageal reflux disease) 05/07/2011  . Hyperlipidemia 05/07/2011  . Osteoporosis 05/07/2011  . Peripheral neuropathy 05/07/2011  . Type 2 diabetes mellitus ([place]) 05/07/2011    MEDICATIONS:  Prior to Admission medications   Medication Sig Start Date End Date Taking? Authorizing Provider  aspirin 81 MG tablet Take 81 mg by mouth daily.   Yes [provider]  calcium-vitamin D (OSCAL WITH D) 500-200 MG-UNIT per tablet Take 1 tablet by mouth daily.   Yes [provider]  ciprofloxacin (CIPRO) 500 MG tablet Take 500 mg by mouth 2 (two) times daily. 10/07/18  Yes [provider]  famotidine (PEPCID) 20 MG tablet Take 20 mg by mouth daily.    Yes [provider]  finasteride (PROSCAR) 5 MG tablet Take 5 mg by mouth daily.   Yes [provider]  galantamine (RAZADYNE  ER) 16 MG 24 hr capsule Take 16 mg by mouth daily. 09/30/18  Yes [provider]  HUMALOG 100 UNIT/ML injection Inject 0-10 Units into the skin See admin instructions. Sliding Scale: 2 units 251-300, 4 units 301-350, 6 units 351-400, 8 units 401-450, 10 units 451-500 10/02/18  Yes [provider]  LANTUS SOLOSTAR 100 UNIT/ML Solostar Pen Inject 10 Units into the skin at bedtime. 10/02/18  Yes [provider]  latanoprost (XALATAN) 0.005 % ophthalmic solution Place 1 drop into both eyes at bedtime.   Yes [provider]  memantine (NAMENDA) 10 MG tablet Take 10 mg by mouth 2 (two) times daily. 09/30/18  Yes [provider]  metFORMIN (GLUCOPHAGE) 500 MG tablet Take 500 mg by mouth 2 (two) times daily with a meal.   Yes [provider]  metoCLOPramide (REGLAN) 10 MG tablet Take 10 mg by mouth 4 (four) times daily as needed for nausea or vomiting.    Yes [provider]  Multiple Vitamin (MULTIVITAMIN) tablet Take 1 tablet by mouth daily.   Yes [provider]  Multiple Vitamins-Minerals (PRESERVISION AREDS 2) CAPS Take 1 capsule by mouth See admin instructions. Three times weekly   Yes [provider]  MYRBETRIQ 50 MG TB24 tablet Take 50 mg by mouth daily. 10/03/18  Yes [provider]  pravastatin (PRAVACHOL) 20 MG tablet Take 20 mg by mouth daily.   Yes [provider]  timolol (  BETIMOL) 0.5 % ophthalmic solution Place 1 drop into the right eye daily.   Yes [provider]  HYDROcodone-acetaminophen (NORCO) 5-325 MG per tablet Take 1 tablet by mouth 3 (three) times daily as needed for severe pain. Patient not taking: Reported on 10/07/2018 10/17/13   Orlie Dakin, MD  metFORMIN (GLUCOPHAGE) 500 MG tablet Take 1 tablet (500 mg total) by mouth daily with breakfast. Patient not taking: Reported on 10/07/2018 02/28/17   Sherwood Gambler, MD  ondansetron (ZOFRAN) 8 MG tablet Take 1 tablet (8 mg total) by  mouth every 8 (eight) hours as needed for nausea or vomiting. Patient not taking: Reported on 10/07/2018 10/17/13   Orlie Dakin, MD    ALLERGIES:  No Known Allergies  SOCIAL HISTORY:  Social History   Tobacco Use  . Smoking status: Former Smoker    Packs/day: 3.00    Quit date: 12/15/1969    Years since quitting: 48.8  Substance Use Topics  . Alcohol use: Yes    Comment: glass of wine nightly    FAMILY HISTORY: Family History  Problem Relation Age of Onset  . Heart attack Father   . Cancer Sister     EXAM: BP (!) 118/96 (BP Location: Left Arm)   Pulse 77   Temp 97.7 F (36.5 C) (Oral)   Resp 15   SpO2 97%  CONSTITUTIONAL: Alert and very hard of hearing and demented.  Unable to answer questions appropriately.  Elderly.  In no distress. HEAD: Normocephalic; atraumatic EYES: Conjunctivae clear, PERRL, EOMI ENT: normal nose; no rhinorrhea; moist mucous membranes; pharynx without lesions noted; no dental injury; no septal hematoma NECK: Supple, no meningismus, no LAD; no midline spinal tenderness, step-off or deformity; trachea midline CARD: RRR; S1 and S2 appreciated; no murmurs, no clicks, no rubs, no gallops RESP: Normal chest excursion without splinting or tachypnea; breath sounds clear and equal bilaterally; no wheezes, no rhonchi, no rales; no hypoxia or respiratory distress CHEST:  chest wall stable, no crepitus or ecchymosis or deformity, nontender to palpation; no flail chest ABD/GI: Normal bowel sounds; non-distended; soft, non-tender, no rebound, no guarding; no ecchymosis or other lesions noted PELVIS:  stable, nontender to palpation BACK:  The back appears normal and is non-tender to palpation, there is no CVA tenderness; no midline spinal tenderness, step-off or deformity EXT: Normal ROM in all joints; non-tender to palpation; no edema; normal capillary refill; no cyanosis, no bony tenderness or bony deformity of patient's extremities, no joint effusion,  compartments are soft, extremities are warm and well-perfused, no ecchymosis SKIN: Normal color for age and race; warm, small skin tear noted to the right elbow NEURO: Moves all extremities equally PSYCH: The patient's mood and manner are appropriate. Grooming and personal hygiene are appropriate.  MEDICAL DECISION MAKING: Patient here after he fell out of his wheelchair.  His son feels that the patient is not at his baseline.  He states approximately 2 months ago the patient was able to hold a conversation and now appears to be different.  He agrees to obtaining lab work, urine, EKG today.  He confirms patient is a DNR/DNI and states he would like to complete an most form for the patient.  He agrees to CT imaging of the head and cervical spine.  Also agrees to updating patient's tetanus vaccination today.  ED PROGRESS: Patient CT head and cervical spine show no acute injury.  Cervical collar removed.  Labs unremarkable.  Urine does appear infected.  You do not have a previous  urine culture in our system.  Will give dose of Rocephin here IM and start him on Keflex.  Have offered admission to the hospital but son would like the patient to go back to [facility] and they will follow-up with their primary doctor.  Discussed return precautions.  Patient's son verbalized understanding.   At this time, I do not feel there is any life-threatening condition present. I have reviewed and discussed all results (EKG, imaging, lab, urine as appropriate) and exam findings with patient/family. I have reviewed nursing notes and appropriate previous records.  I feel the patient is safe to be discharged home without further emergent workup and can continue workup as an outpatient as needed. Discussed usual and customary return precautions. Patient/family verbalize understanding and are comfortable with this plan.  Outpatient follow-up has been provided as needed. All questions have been answered.      EKG  Interpretation  Date/Time:  Tuesday October 07 2018 22:44:44 EDT Ventricular Rate:  79 PR Interval:    QRS Duration: 95 QT Interval:  396 QTC Calculation: 553 R Axis:   -85 Text Interpretation:  Sinus rhythm Short PR interval Inferolateral infarct, old No significant change since last tracing Confirmed by , Cyril Mourning [phone]) on 10/07/2018 11:55:42 PM         , Delice Bison, DO 10/08/18 0786

## 2018-10-08 DIAGNOSIS — M2550 Pain in unspecified joint: Secondary | ICD-10-CM | POA: Diagnosis not present

## 2018-10-08 DIAGNOSIS — R5381 Other malaise: Secondary | ICD-10-CM | POA: Diagnosis not present

## 2018-10-08 DIAGNOSIS — S0990XA Unspecified injury of head, initial encounter: Secondary | ICD-10-CM | POA: Diagnosis not present

## 2018-10-08 DIAGNOSIS — R51 Headache: Secondary | ICD-10-CM | POA: Diagnosis not present

## 2018-10-08 DIAGNOSIS — Z7401 Bed confinement status: Secondary | ICD-10-CM | POA: Diagnosis not present

## 2018-10-08 DIAGNOSIS — S199XXA Unspecified injury of neck, initial encounter: Secondary | ICD-10-CM | POA: Diagnosis not present

## 2018-10-08 DIAGNOSIS — W19XXXA Unspecified fall, initial encounter: Secondary | ICD-10-CM | POA: Diagnosis not present

## 2018-10-08 LAB — URINE CULTURE

## 2018-10-08 LAB — URINALYSIS, ROUTINE W REFLEX MICROSCOPIC
Bilirubin Urine: NEGATIVE
Glucose, UA: 50 mg/dL — AB
Ketones, ur: NEGATIVE mg/dL
Nitrite: POSITIVE — AB
Protein, ur: 30 mg/dL — AB
Specific Gravity, Urine: 1.018 (ref 1.005–1.030)
pH: 5 (ref 5.0–8.0)

## 2018-10-08 LAB — BASIC METABOLIC PANEL
Anion gap: 8 (ref 5–15)
BUN: 26 mg/dL — ABNORMAL HIGH (ref 8–23)
CO2: 23 mmol/L (ref 22–32)
Calcium: 8.4 mg/dL — ABNORMAL LOW (ref 8.9–10.3)
Chloride: 106 mmol/L (ref 98–111)
Creatinine, Ser: 0.97 mg/dL (ref 0.61–1.24)
GFR calc Af Amer: >60 mL/min (ref >60)
GFR calc non Af Amer: >60 mL/min (ref >60)
Glucose, Bld: 84 mg/dL (ref 70–99)
Potassium: 4.5 mmol/L (ref 3.5–5.1)
Sodium: 137 mmol/L (ref 135–145)

## 2018-10-08 MED ORDER — CEFTRIAXONE SODIUM 1 G IJ SOLR
1.0000 g | Freq: Once | INTRAMUSCULAR | Status: AC
  Administered 2018-10-08: 1 g via INTRAMUSCULAR
  Filled 2018-10-08: qty 10

## 2018-10-08 MED ORDER — STERILE WATER FOR INJECTION IJ SOLN
INTRAMUSCULAR | Status: AC
  Administered 2018-10-08: 2.1 mL
  Filled 2018-10-08: qty 10

## 2018-10-08 MED ORDER — CEPHALEXIN 500 MG PO CAPS: 500.0000 mg | ORAL_CAPSULE | Freq: Three times a day (TID) | ORAL | 0 refills | Status: AC

## 2018-10-08 NOTE — ED Notes (Signed)
[facility] called for transport.  

## 2018-10-10 DIAGNOSIS — S8011XD Contusion of right lower leg, subsequent encounter: Secondary | ICD-10-CM | POA: Diagnosis not present

## 2018-10-10 DIAGNOSIS — N390 Urinary tract infection, site not specified: Secondary | ICD-10-CM | POA: Diagnosis not present

## 2018-10-10 DIAGNOSIS — E1142 Type 2 diabetes mellitus with diabetic polyneuropathy: Secondary | ICD-10-CM | POA: Diagnosis not present

## 2018-10-10 DIAGNOSIS — I10 Essential (primary) hypertension: Secondary | ICD-10-CM | POA: Diagnosis not present

## 2018-10-21 DIAGNOSIS — E538 Deficiency of other specified B group vitamins: Secondary | ICD-10-CM | POA: Diagnosis not present

## 2018-10-21 DIAGNOSIS — M1990 Unspecified osteoarthritis, unspecified site: Secondary | ICD-10-CM | POA: Diagnosis not present

## 2018-11-01 DIAGNOSIS — R2681 Unsteadiness on feet: Secondary | ICD-10-CM | POA: Diagnosis not present

## 2018-11-01 DIAGNOSIS — R2689 Other abnormalities of gait and mobility: Secondary | ICD-10-CM | POA: Diagnosis not present

## 2018-11-01 DIAGNOSIS — G8929 Other chronic pain: Secondary | ICD-10-CM | POA: Diagnosis not present

## 2018-11-01 DIAGNOSIS — F0280 Dementia in other diseases classified elsewhere without behavioral disturbance: Secondary | ICD-10-CM | POA: Diagnosis not present

## 2018-12-02 DIAGNOSIS — G8929 Other chronic pain: Secondary | ICD-10-CM | POA: Diagnosis not present

## 2018-12-02 DIAGNOSIS — R2681 Unsteadiness on feet: Secondary | ICD-10-CM | POA: Diagnosis not present

## 2018-12-02 DIAGNOSIS — R2689 Other abnormalities of gait and mobility: Secondary | ICD-10-CM | POA: Diagnosis not present

## 2018-12-02 DIAGNOSIS — F0280 Dementia in other diseases classified elsewhere without behavioral disturbance: Secondary | ICD-10-CM | POA: Diagnosis not present

## 2018-12-15 DIAGNOSIS — R531 Weakness: Secondary | ICD-10-CM | POA: Diagnosis not present

## 2018-12-15 DIAGNOSIS — Z743 Need for continuous supervision: Secondary | ICD-10-CM | POA: Diagnosis not present

## 2018-12-15 DIAGNOSIS — R4701 Aphasia: Secondary | ICD-10-CM | POA: Diagnosis not present

## 2018-12-15 DIAGNOSIS — G459 Transient cerebral ischemic attack, unspecified: Secondary | ICD-10-CM | POA: Diagnosis not present

## 2018-12-15 DIAGNOSIS — I517 Cardiomegaly: Secondary | ICD-10-CM | POA: Diagnosis not present

## 2018-12-15 DIAGNOSIS — R404 Transient alteration of awareness: Secondary | ICD-10-CM | POA: Diagnosis not present

## 2018-12-15 DIAGNOSIS — Z7982 Long term (current) use of aspirin: Secondary | ICD-10-CM | POA: Diagnosis not present

## 2018-12-15 DIAGNOSIS — Z79899 Other long term (current) drug therapy: Secondary | ICD-10-CM | POA: Diagnosis not present

## 2018-12-15 DIAGNOSIS — F329 Major depressive disorder, single episode, unspecified: Secondary | ICD-10-CM | POA: Diagnosis not present

## 2018-12-15 DIAGNOSIS — I351 Nonrheumatic aortic (valve) insufficiency: Secondary | ICD-10-CM | POA: Diagnosis not present

## 2018-12-15 DIAGNOSIS — I10 Essential (primary) hypertension: Secondary | ICD-10-CM | POA: Diagnosis not present

## 2018-12-15 DIAGNOSIS — R1310 Dysphagia, unspecified: Secondary | ICD-10-CM | POA: Diagnosis not present

## 2018-12-15 DIAGNOSIS — K219 Gastro-esophageal reflux disease without esophagitis: Secondary | ICD-10-CM | POA: Diagnosis not present

## 2018-12-15 DIAGNOSIS — N400 Enlarged prostate without lower urinary tract symptoms: Secondary | ICD-10-CM | POA: Diagnosis not present

## 2018-12-15 DIAGNOSIS — I63511 Cerebral infarction due to unspecified occlusion or stenosis of right middle cerebral artery: Secondary | ICD-10-CM | POA: Diagnosis not present

## 2018-12-15 DIAGNOSIS — R471 Dysarthria and anarthria: Secondary | ICD-10-CM | POA: Diagnosis not present

## 2018-12-15 DIAGNOSIS — I340 Nonrheumatic mitral (valve) insufficiency: Secondary | ICD-10-CM | POA: Diagnosis not present

## 2018-12-15 DIAGNOSIS — I63512 Cerebral infarction due to unspecified occlusion or stenosis of left middle cerebral artery: Secondary | ICD-10-CM | POA: Diagnosis not present

## 2018-12-15 DIAGNOSIS — R279 Unspecified lack of coordination: Secondary | ICD-10-CM | POA: Diagnosis not present

## 2018-12-15 DIAGNOSIS — Z4659 Encounter for fitting and adjustment of other gastrointestinal appliance and device: Secondary | ICD-10-CM | POA: Diagnosis not present

## 2018-12-15 DIAGNOSIS — F0390 Unspecified dementia without behavioral disturbance: Secondary | ICD-10-CM | POA: Diagnosis not present

## 2018-12-15 DIAGNOSIS — R29719 NIHSS score 19: Secondary | ICD-10-CM | POA: Diagnosis not present

## 2018-12-15 DIAGNOSIS — E785 Hyperlipidemia, unspecified: Secondary | ICD-10-CM | POA: Diagnosis not present

## 2018-12-15 DIAGNOSIS — H9190 Unspecified hearing loss, unspecified ear: Secondary | ICD-10-CM | POA: Diagnosis not present

## 2018-12-15 DIAGNOSIS — E119 Type 2 diabetes mellitus without complications: Secondary | ICD-10-CM | POA: Diagnosis not present

## 2018-12-15 DIAGNOSIS — G8190 Hemiplegia, unspecified affecting unspecified side: Secondary | ICD-10-CM | POA: Diagnosis not present

## 2018-12-15 DIAGNOSIS — H53462 Homonymous bilateral field defects, left side: Secondary | ICD-10-CM | POA: Diagnosis not present

## 2019-01-01 DIAGNOSIS — F0280 Dementia in other diseases classified elsewhere without behavioral disturbance: Secondary | ICD-10-CM | POA: Diagnosis not present

## 2019-01-01 DIAGNOSIS — R2689 Other abnormalities of gait and mobility: Secondary | ICD-10-CM | POA: Diagnosis not present

## 2019-01-01 DIAGNOSIS — R2681 Unsteadiness on feet: Secondary | ICD-10-CM | POA: Diagnosis not present

## 2019-01-01 DIAGNOSIS — G8929 Other chronic pain: Secondary | ICD-10-CM | POA: Diagnosis not present

## 2019-02-03 DEATH — deceased

## 2019-10-15 IMAGING — CR CHEST - 2 VIEW
2 series · 2 of 2 positions shown · non-contrast
Comparison: 10/17/2013

CLINICAL DATA: Upper extremity infection

EXAM:
CHEST - 2 VIEW

[w chest lat]
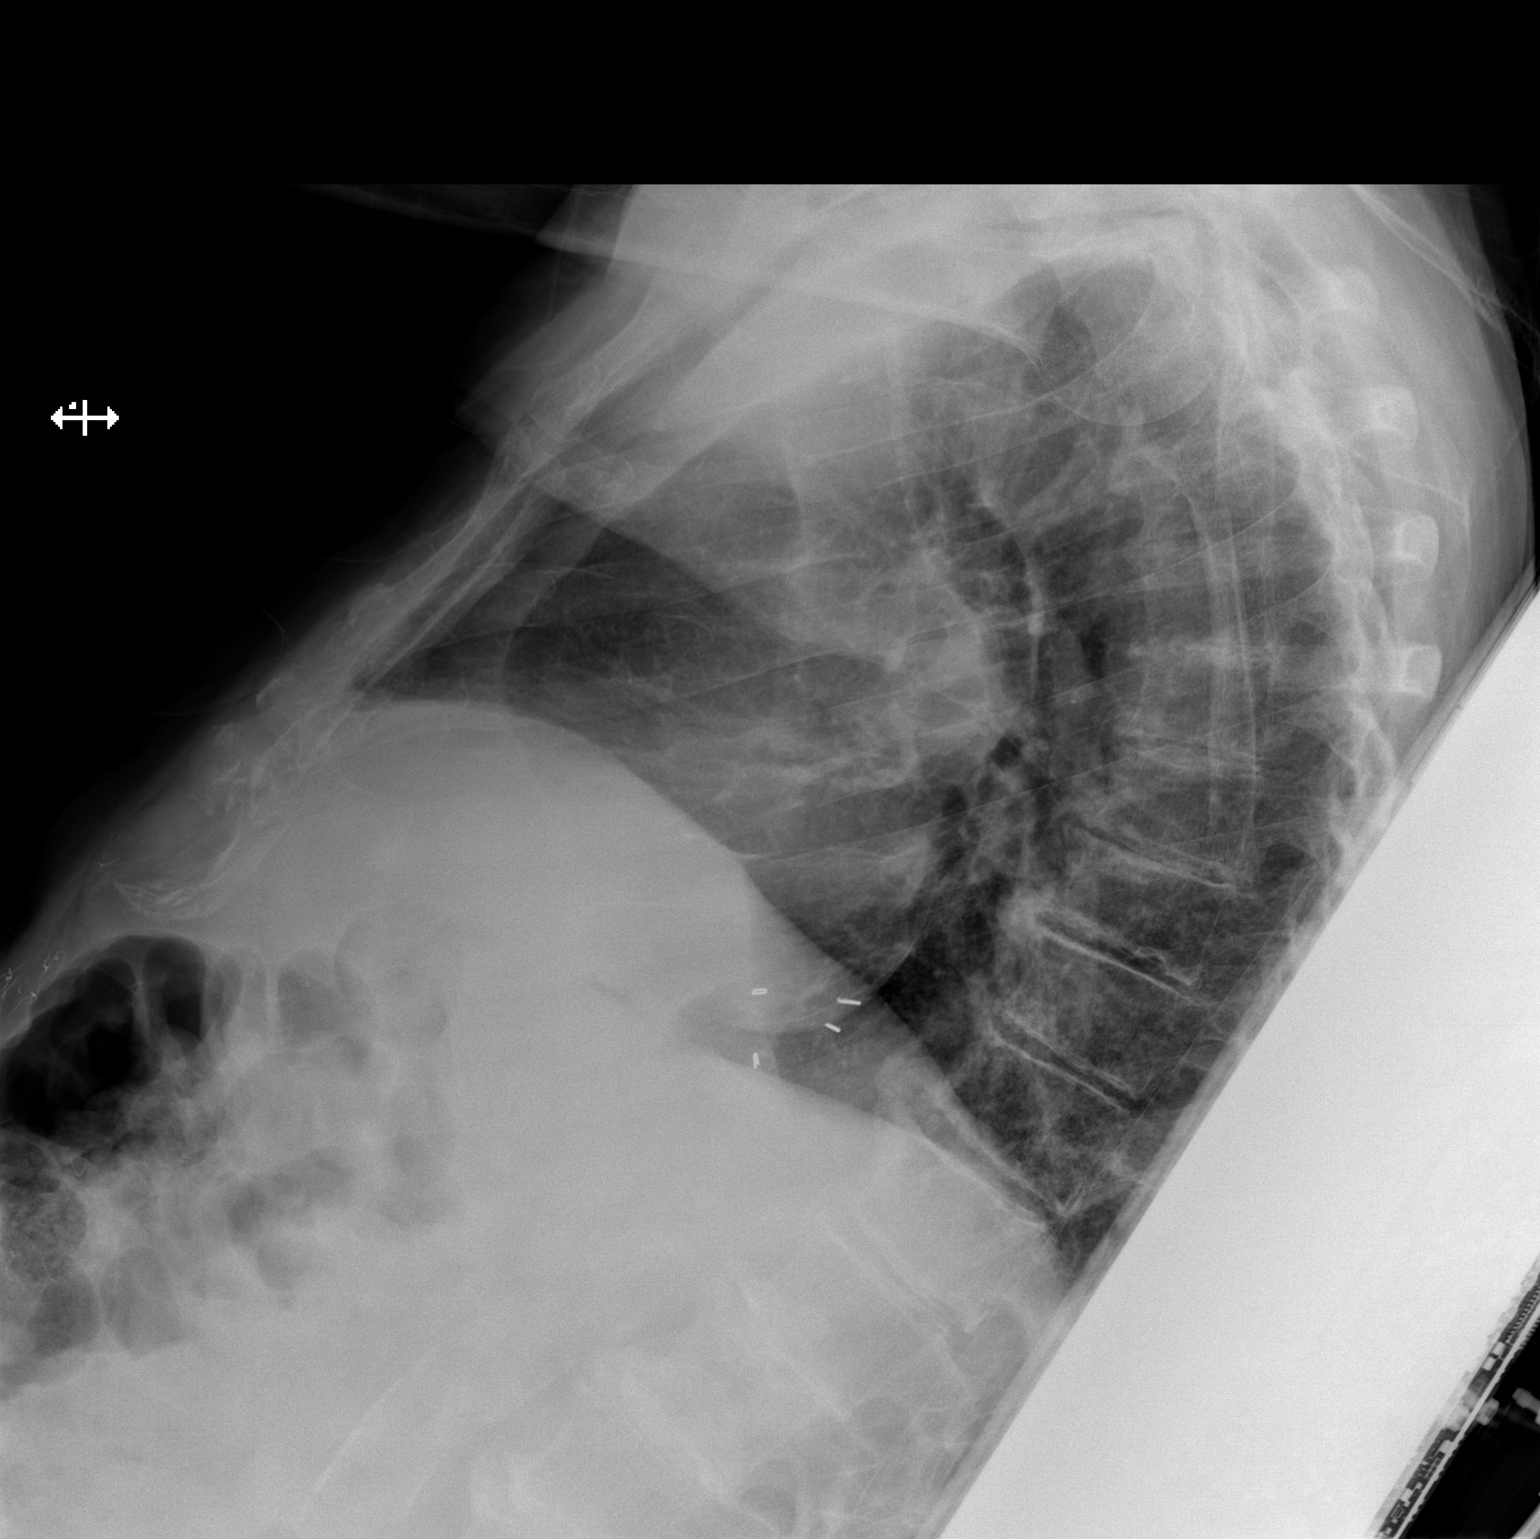

[x chest ap]
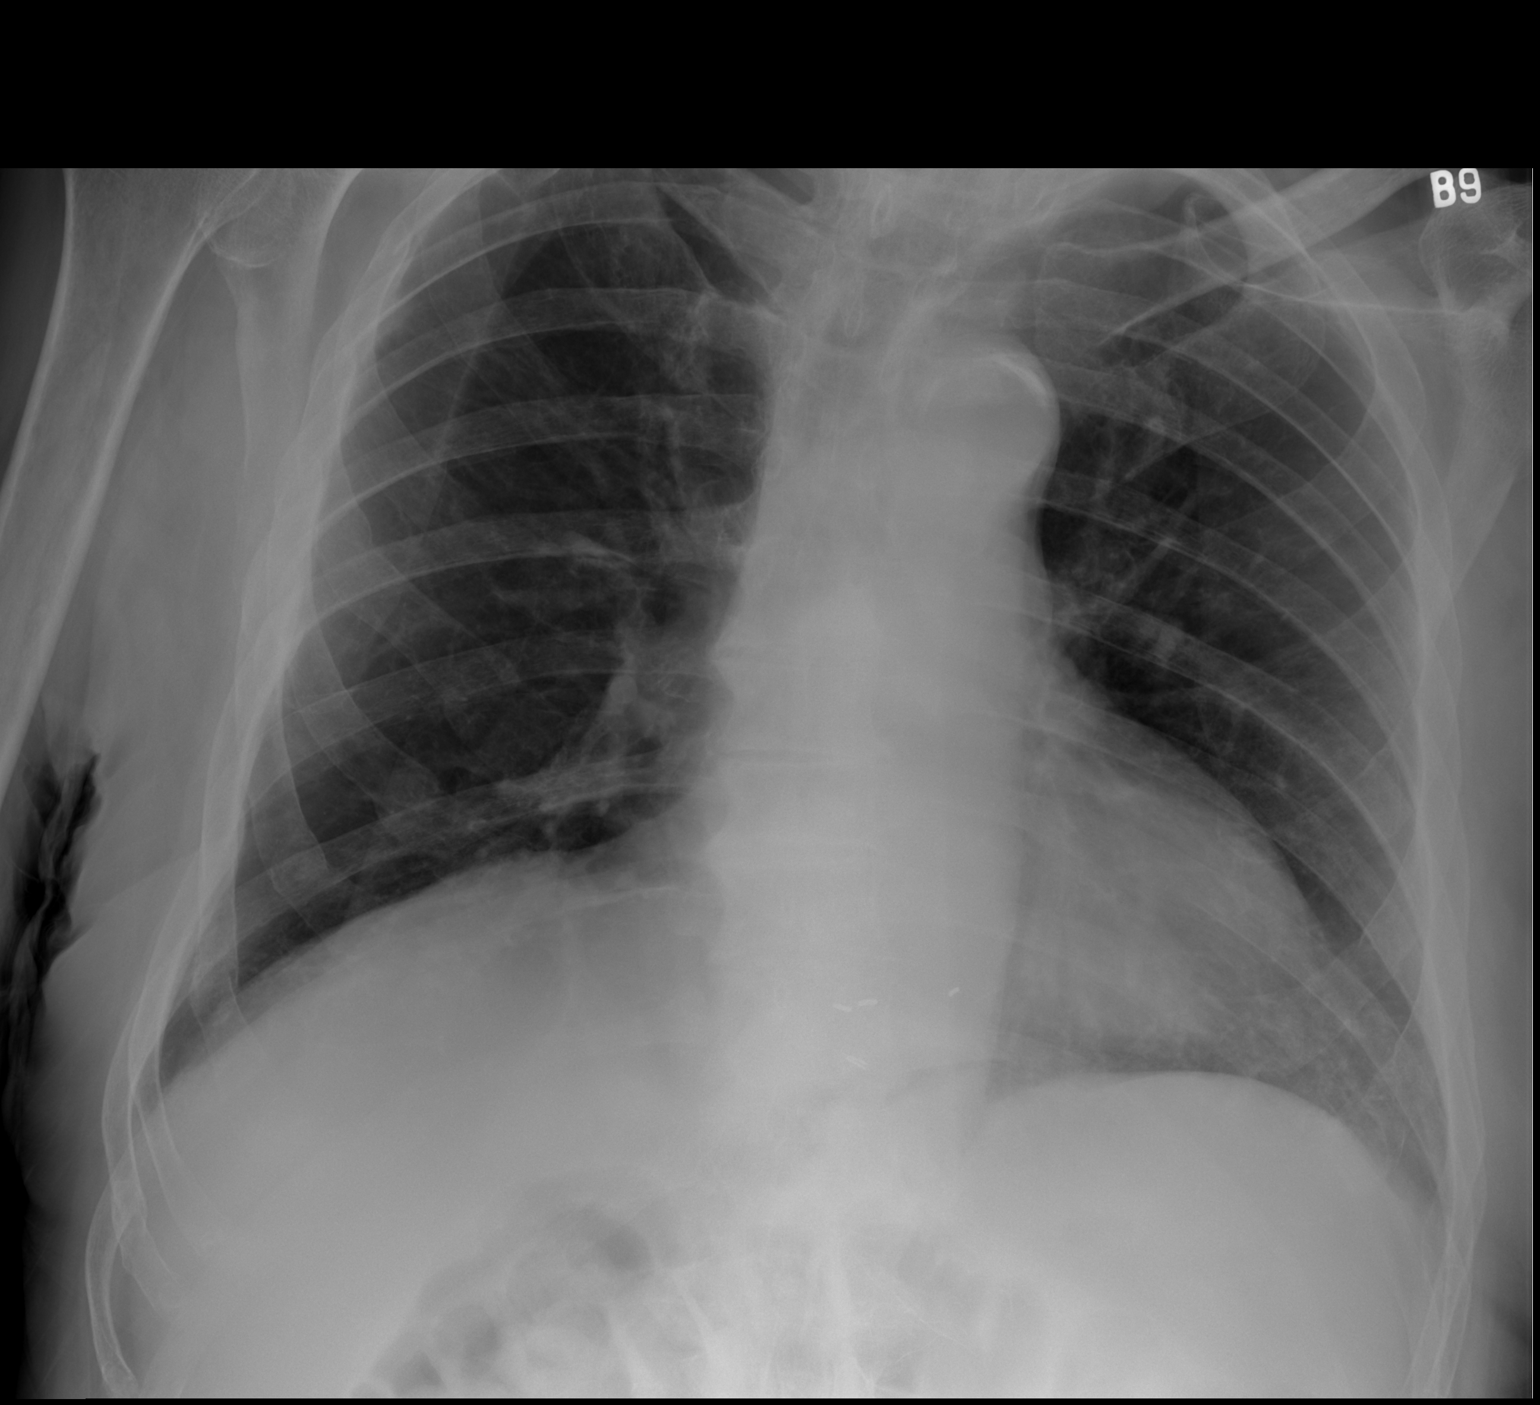

[2 of 2 positions shown; findings below may reference images not displayed]

FINDINGS: No acute airspace disease or pleural effusion. Cardiomediastinal
silhouette within normal limits. Aortic atherosclerosis. No
pneumothorax. Old right-sided rib fractures.
IMPRESSION: No active cardiopulmonary disease.
